# Patient Record
Sex: Female | Born: 1945 | Race: White | Hispanic: No | Marital: Married | Smoking: Never smoker
Health system: Southern US, Community
[De-identification: ages and names within clinical notes are randomized; demographics above are authoritative.]

## PROBLEM LIST (undated history)

## (undated) DIAGNOSIS — R001 Bradycardia, unspecified: Secondary | ICD-10-CM

## (undated) DIAGNOSIS — M1990 Unspecified osteoarthritis, unspecified site: Secondary | ICD-10-CM

## (undated) DIAGNOSIS — I517 Cardiomegaly: Secondary | ICD-10-CM

## (undated) DIAGNOSIS — R0602 Shortness of breath: Secondary | ICD-10-CM

## (undated) DIAGNOSIS — M8580 Other specified disorders of bone density and structure, unspecified site: Secondary | ICD-10-CM

## (undated) DIAGNOSIS — K5790 Diverticulosis of intestine, part unspecified, without perforation or abscess without bleeding: Secondary | ICD-10-CM

## (undated) DIAGNOSIS — R0600 Dyspnea, unspecified: Secondary | ICD-10-CM

## (undated) DIAGNOSIS — E785 Hyperlipidemia, unspecified: Secondary | ICD-10-CM

## (undated) DIAGNOSIS — R0609 Other forms of dyspnea: Secondary | ICD-10-CM

## (undated) DIAGNOSIS — K219 Gastro-esophageal reflux disease without esophagitis: Secondary | ICD-10-CM

## (undated) DIAGNOSIS — R0601 Orthopnea: Secondary | ICD-10-CM

## (undated) DIAGNOSIS — T7840XA Allergy, unspecified, initial encounter: Secondary | ICD-10-CM

## (undated) DIAGNOSIS — I493 Ventricular premature depolarization: Secondary | ICD-10-CM

## (undated) DIAGNOSIS — K862 Cyst of pancreas: Secondary | ICD-10-CM

## (undated) DIAGNOSIS — R6884 Jaw pain: Secondary | ICD-10-CM

## (undated) DIAGNOSIS — R079 Chest pain, unspecified: Secondary | ICD-10-CM

## (undated) DIAGNOSIS — K5900 Constipation, unspecified: Secondary | ICD-10-CM

## (undated) HISTORY — DX: Diverticulosis of intestine, part unspecified, without perforation or abscess without bleeding: K57.90

## (undated) HISTORY — DX: Allergy, unspecified, initial encounter: T78.40XA

## (undated) HISTORY — DX: Other forms of dyspnea: R06.09

## (undated) HISTORY — DX: Orthopnea: R06.01

## (undated) HISTORY — DX: Cyst of pancreas: K86.2

## (undated) HISTORY — DX: Other specified disorders of bone density and structure, unspecified site: M85.80

## (undated) HISTORY — PX: TONSILLECTOMY: SUR1361

## (undated) HISTORY — DX: Cardiomegaly: I51.7

## (undated) HISTORY — DX: Constipation, unspecified: K59.00

## (undated) HISTORY — DX: Dyspnea, unspecified: R06.00

## (undated) HISTORY — PX: BREAST BIOPSY: SHX20

## (undated) HISTORY — DX: Ventricular premature depolarization: I49.3

## (undated) HISTORY — DX: Jaw pain: R68.84

## (undated) HISTORY — DX: Bradycardia, unspecified: R00.1

## (undated) HISTORY — DX: Chest pain, unspecified: R07.9

## (undated) HISTORY — DX: Hyperlipidemia, unspecified: E78.5

## (undated) HISTORY — DX: Shortness of breath: R06.02

---

## 2000-05-09 ENCOUNTER — Encounter: Admission: RE | Admit: 2000-05-09 | Discharge: 2000-05-09 | Payer: Self-pay | Admitting: Obstetrics and Gynecology

## 2000-05-09 ENCOUNTER — Encounter: Payer: Self-pay | Admitting: Obstetrics and Gynecology

## 2001-04-03 ENCOUNTER — Other Ambulatory Visit: Admission: RE | Admit: 2001-04-03 | Discharge: 2001-04-03 | Payer: Self-pay | Admitting: Obstetrics and Gynecology

## 2001-04-03 ENCOUNTER — Encounter (INDEPENDENT_AMBULATORY_CARE_PROVIDER_SITE_OTHER): Payer: Self-pay | Admitting: *Deleted

## 2001-06-17 ENCOUNTER — Encounter: Admission: RE | Admit: 2001-06-17 | Discharge: 2001-06-17 | Payer: Self-pay | Admitting: Obstetrics and Gynecology

## 2001-06-17 ENCOUNTER — Encounter: Payer: Self-pay | Admitting: Obstetrics and Gynecology

## 2002-06-19 ENCOUNTER — Encounter: Payer: Self-pay | Admitting: Obstetrics and Gynecology

## 2002-06-19 ENCOUNTER — Encounter: Admission: RE | Admit: 2002-06-19 | Discharge: 2002-06-19 | Payer: Self-pay | Admitting: Obstetrics and Gynecology

## 2003-07-24 ENCOUNTER — Encounter: Admission: RE | Admit: 2003-07-24 | Discharge: 2003-07-24 | Payer: Self-pay | Admitting: Obstetrics and Gynecology

## 2004-07-10 ENCOUNTER — Emergency Department (HOSPITAL_COMMUNITY): Admission: EM | Admit: 2004-07-10 | Discharge: 2004-07-10 | Payer: Self-pay | Admitting: Emergency Medicine

## 2004-08-12 ENCOUNTER — Encounter: Admission: RE | Admit: 2004-08-12 | Discharge: 2004-08-12 | Payer: Self-pay | Admitting: Obstetrics and Gynecology

## 2004-12-15 ENCOUNTER — Ambulatory Visit: Payer: Self-pay | Admitting: Gastroenterology

## 2004-12-30 ENCOUNTER — Ambulatory Visit: Payer: Self-pay | Admitting: Gastroenterology

## 2004-12-30 HISTORY — PX: COLONOSCOPY: SHX174

## 2005-08-24 ENCOUNTER — Encounter: Admission: RE | Admit: 2005-08-24 | Discharge: 2005-08-24 | Payer: Self-pay | Admitting: Obstetrics and Gynecology

## 2006-05-16 ENCOUNTER — Ambulatory Visit: Payer: Self-pay | Admitting: Gastroenterology

## 2006-05-23 ENCOUNTER — Ambulatory Visit (HOSPITAL_COMMUNITY): Admission: RE | Admit: 2006-05-23 | Discharge: 2006-05-23 | Payer: Self-pay | Admitting: Gastroenterology

## 2006-05-23 ENCOUNTER — Encounter (INDEPENDENT_AMBULATORY_CARE_PROVIDER_SITE_OTHER): Payer: Self-pay | Admitting: Specialist

## 2006-05-29 ENCOUNTER — Ambulatory Visit: Payer: Self-pay | Admitting: Gastroenterology

## 2006-08-27 ENCOUNTER — Encounter: Admission: RE | Admit: 2006-08-27 | Discharge: 2006-08-27 | Payer: Self-pay | Admitting: Obstetrics and Gynecology

## 2007-08-29 ENCOUNTER — Encounter: Admission: RE | Admit: 2007-08-29 | Discharge: 2007-08-29 | Payer: Self-pay | Admitting: Obstetrics and Gynecology

## 2008-10-05 ENCOUNTER — Encounter: Admission: RE | Admit: 2008-10-05 | Discharge: 2008-10-05 | Payer: Self-pay | Admitting: Obstetrics & Gynecology

## 2009-10-13 ENCOUNTER — Encounter: Admission: RE | Admit: 2009-10-13 | Discharge: 2009-10-13 | Payer: Self-pay | Admitting: Obstetrics & Gynecology

## 2010-04-07 ENCOUNTER — Encounter: Admission: RE | Admit: 2010-04-07 | Discharge: 2010-04-07 | Payer: Self-pay | Admitting: Internal Medicine

## 2010-10-18 ENCOUNTER — Other Ambulatory Visit: Payer: Self-pay | Admitting: Obstetrics & Gynecology

## 2010-10-18 DIAGNOSIS — Z1231 Encounter for screening mammogram for malignant neoplasm of breast: Secondary | ICD-10-CM

## 2010-10-21 ENCOUNTER — Ambulatory Visit
Admission: RE | Admit: 2010-10-21 | Discharge: 2010-10-21 | Disposition: A | Discharge status: Home / Self Care | Payer: BC Managed Care – PPO | Source: Ambulatory Visit | Attending: Obstetrics & Gynecology | Admitting: Obstetrics & Gynecology

## 2010-10-21 DIAGNOSIS — Z1231 Encounter for screening mammogram for malignant neoplasm of breast: Secondary | ICD-10-CM

## 2011-09-26 ENCOUNTER — Other Ambulatory Visit: Payer: Self-pay | Admitting: Obstetrics & Gynecology

## 2011-09-26 DIAGNOSIS — Z1231 Encounter for screening mammogram for malignant neoplasm of breast: Secondary | ICD-10-CM

## 2011-09-29 ENCOUNTER — Encounter: Payer: Self-pay | Admitting: Gastroenterology

## 2011-10-11 ENCOUNTER — Encounter: Payer: Self-pay | Admitting: Gastroenterology

## 2011-10-11 ENCOUNTER — Ambulatory Visit (INDEPENDENT_AMBULATORY_CARE_PROVIDER_SITE_OTHER): Payer: BC Managed Care – PPO | Admitting: Gastroenterology

## 2011-10-11 VITALS — BP 150/80 | HR 70 | Ht 61.0 in | Wt 114.0 lb

## 2011-10-11 DIAGNOSIS — R1084 Generalized abdominal pain: Secondary | ICD-10-CM

## 2011-10-11 DIAGNOSIS — K5900 Constipation, unspecified: Secondary | ICD-10-CM

## 2011-10-11 DIAGNOSIS — R933 Abnormal findings on diagnostic imaging of other parts of digestive tract: Secondary | ICD-10-CM

## 2011-10-11 NOTE — Patient Instructions (Addendum)
  You have been scheduled for a CT scan of the abdomen and pelvis at [facility] ([address]---this is in the same building as [facility]).   You are scheduled on 10/12/11 at 2:30. You should arrive 30 minutes prior to your appointment time for registration. Please follow the written instructions below on the day of your exam:  WARNING: IF YOU ARE ALLERGIC TO IODINE/X-RAY DYE, PLEASE NOTIFY RADIOLOGY IMMEDIATELY AT [phone]! YOU WILL BE GIVEN A 13 HOUR PREMEDICATION PREP.  1) Do not eat or drink anything after 10:30am (4 hours prior to your test) 2) You have been given 2 bottles of oral contrast to drink. The solution may taste better if refrigerated, but do NOT add ice or any other liquid to this solution. Shake well before drinking.    You may take any medications as prescribed with a small amount of water except for the following: Metformin, Glucophage, Glucovance, Avandamet, Riomet, Fortamet, Actoplus Met, Janumet, Glumetza or Metaglip. The above medications must be held the day of the exam AND 48 hours after the exam.  If you have any questions regarding your exam or if you need to reschedule, you may call the CT department at [phone] between the hours of 8:00 am and 5:00 pm, Monday-Friday.  ________________________________________________________________________  Increase taking your Miralax 17 grams in 8 oz of water twice daily titrate depending on bowel movements.   cc: Guerry Bruin, MD

## 2011-10-11 NOTE — Progress Notes (Signed)
History of Present Illness: This is a 66-year-old female who has had worsening problems with constipation associated with generalized abdominal bloating and pain that radiates along both costal margins over the past few months. Her symptoms have temporarily improved with MiraLax and stool softeners. 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 Jacobs. 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 She underwent colonoscopy in April 2006 that showed mild sigmoid colon diverticulosis. Blood work from earlier this month from Dr. Tisovec's office was normal. Denies weight loss, diarrhea, change in stool caliber, melena, hematochezia, nausea, vomiting, dysphagia, reflux symptoms, chest pain.  No Known Allergies No outpatient prescriptions prior to visit.   Past Medical History  Diagnosis Date  . Diverticulosis   . Pancreatic cyst   . Hyperlipidemia   . Osteopenia   . Vitamin D deficiency    Past Surgical History  Procedure Date  . Tonsillectomy   . Breast biopsy     Right-Benign   History   Social History  . Marital Status: Married    Spouse Name: N/A    Number of Children: 2  . Years of Education: N/A   Occupational History  . Homemaker    Social History Main Topics  . Smoking status: Never Smoker   . Smokeless tobacco: Never Used  . Alcohol Use: Yes     socially  . Drug Use: No  . Sexually Active: None   Other Topics Concern  . None   Social History Narrative   Occ caffeine    Family History  Problem Relation Age of Onset  . Emphysema Father   . Emphysema Mother   . Osteoporosis Mother   . Atrial fibrillation Mother   . Macular degeneration Paternal Grandmother   . Colon cancer Neg Hx   . Throat cancer Mother   . Cancer Mother     Neck    Review of Systems: Pertinent positive and negative review of systems were  noted in the above HPI section. All other review of systems were otherwise negative.  Physical Exam: General: Well developed , well nourished, no acute distress Head: Normocephalic and atraumatic Eyes:  sclerae anicteric, EOMI Ears: Normal auditory acuity Mouth: No deformity or lesions Neck: Supple, no masses or thyromegaly Lungs: Clear throughout to auscultation Heart: Regular rate and rhythm; no murmurs, rubs or bruits Abdomen: Soft, non tender and non distended. No masses, hepatosplenomegaly or hernias noted. Normal Bowel sounds Musculoskeletal: Symmetrical with no gross deformities  Skin: No lesions on visible extremities Pulses:  Normal pulses noted Extremities: No clubbing, cyanosis, edema or deformities noted Neurological: Alert oriented x 4, grossly nonfocal Cervical Nodes:  No significant cervical adenopathy Inguinal Nodes: No significant inguinal adenopathy Psychological:  Alert and cooperative. Normal mood and affect  Assessment and Recommendations:  1. Constipation. Increase MiraLax to one dose every other day up to two doses per day titrated for adequate bowel movements. I suspect this is the main factor behind her generalized abdominal pain and bloating. If her symptoms do not improve consider colonoscopy.  2. Generalized abdominal pain. See problem #1. Schedule abdominal/pelvic CT scan.  3. 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 I reviewed the prior CT scan report and endoscopic ultrasound report with Dr. Jacobs today. Schedule pancreatic protocol CT followup and if the lesion has increased in size schedule repeat endoscopic ultrasound.   If it is stable or smaller no further followup needed. 

## 2011-10-12 ENCOUNTER — Ambulatory Visit (INDEPENDENT_AMBULATORY_CARE_PROVIDER_SITE_OTHER)
Admission: RE | Admit: 2011-10-12 | Discharge: 2011-10-12 | Disposition: A | Discharge status: Home / Self Care | Payer: BC Managed Care – PPO | Source: Ambulatory Visit | Attending: Gastroenterology | Admitting: Gastroenterology

## 2011-10-12 DIAGNOSIS — R933 Abnormal findings on diagnostic imaging of other parts of digestive tract: Secondary | ICD-10-CM

## 2011-10-12 DIAGNOSIS — R1084 Generalized abdominal pain: Secondary | ICD-10-CM

## 2011-10-12 MED ORDER — IOHEXOL 300 MG/ML  SOLN
100.0000 mL | Freq: Once | INTRAMUSCULAR | Status: AC | PRN
  Administered 2011-10-12: 100 mL via INTRAVENOUS

## 2011-10-18 ENCOUNTER — Telehealth: Payer: Self-pay

## 2011-10-18 DIAGNOSIS — K862 Cyst of pancreas: Secondary | ICD-10-CM

## 2011-10-18 NOTE — Telephone Encounter (Signed)
Message copied by Donata Duff on Wed Oct 18, 2011  4:17 PM ------      Message from: Rob Bunting P      Created: Wed Oct 18, 2011  3:51 PM       Lavonna Rua,      I looked at the CT scan, my EUS report from 2007. The cyst has grown slightly and I think a repeat EUS to see if CEA, cytology has also changed is reasonable. I will also send this to Shaunn Tackitt to schedule upper EUS, radial +/- linear EUS for pancreatic cyst. She required a LOT of meds last time so it is safest to do with propofol this time.            Thanks            dj            ----- Message -----         From: Rossie Muskrat, RN,CGRN         Sent: 10/18/2011   2:14 PM           To: Rob Bunting, MD            I have placed the cori report and the path from 07 on your desk.              Sheri      ----- Message -----         From: Rob Bunting, MD         Sent: 10/18/2011   1:51 PM           To: Rossie Muskrat, RN,CGRN            He did, but i have not gotten to it yet.  I looked at the CT report, can you pull my EUS from 2007, I'd like to see how I measured it comparted to the CT measurements currently.            Thanks                  ----- Message -----         From: Rossie Muskrat, RN,CGRN         Sent: 10/18/2011   1:34 PM           To: Rob Bunting, MD            Did Dr Russella Dar talk to you about doing an EUS on this patient?  Please review CT from 10/11/11 and advise.              Thanks,            NIKE

## 2011-10-19 ENCOUNTER — Other Ambulatory Visit: Payer: Self-pay

## 2011-10-19 NOTE — Telephone Encounter (Signed)
Pt has been instructed and meds reviewed.  She will call with any questions after receiving the information in the mail

## 2011-10-25 ENCOUNTER — Ambulatory Visit
Admission: RE | Admit: 2011-10-25 | Discharge: 2011-10-25 | Disposition: A | Discharge status: Home / Self Care | Payer: BC Managed Care – PPO | Source: Ambulatory Visit | Attending: Obstetrics & Gynecology | Admitting: Obstetrics & Gynecology

## 2011-10-25 DIAGNOSIS — Z1231 Encounter for screening mammogram for malignant neoplasm of breast: Secondary | ICD-10-CM

## 2011-11-06 ENCOUNTER — Encounter (HOSPITAL_COMMUNITY): Payer: Self-pay | Admitting: Gastroenterology

## 2011-11-09 ENCOUNTER — Ambulatory Visit (HOSPITAL_COMMUNITY): Payer: BC Managed Care – PPO | Admitting: Anesthesiology

## 2011-11-09 ENCOUNTER — Encounter (HOSPITAL_COMMUNITY)
Admission: RE | Disposition: A | Discharge status: Home / Self Care | Payer: Self-pay | Source: Ambulatory Visit | Attending: Gastroenterology

## 2011-11-09 ENCOUNTER — Encounter (HOSPITAL_COMMUNITY): Payer: Self-pay | Admitting: Anesthesiology

## 2011-11-09 ENCOUNTER — Encounter (HOSPITAL_COMMUNITY): Payer: Self-pay | Admitting: Gastroenterology

## 2011-11-09 ENCOUNTER — Ambulatory Visit (HOSPITAL_COMMUNITY)
Admission: RE | Admit: 2011-11-09 | Discharge: 2011-11-09 | Disposition: A | Discharge status: Home / Self Care | Payer: BC Managed Care – PPO | Source: Ambulatory Visit | Attending: Gastroenterology | Admitting: Gastroenterology

## 2011-11-09 DIAGNOSIS — Q391 Atresia of esophagus with tracheo-esophageal fistula: Secondary | ICD-10-CM | POA: Insufficient documentation

## 2011-11-09 DIAGNOSIS — K5730 Diverticulosis of large intestine without perforation or abscess without bleeding: Secondary | ICD-10-CM | POA: Insufficient documentation

## 2011-11-09 DIAGNOSIS — E785 Hyperlipidemia, unspecified: Secondary | ICD-10-CM | POA: Insufficient documentation

## 2011-11-09 DIAGNOSIS — R1084 Generalized abdominal pain: Secondary | ICD-10-CM | POA: Insufficient documentation

## 2011-11-09 DIAGNOSIS — K862 Cyst of pancreas: Secondary | ICD-10-CM | POA: Insufficient documentation

## 2011-11-09 DIAGNOSIS — R933 Abnormal findings on diagnostic imaging of other parts of digestive tract: Secondary | ICD-10-CM

## 2011-11-09 DIAGNOSIS — E559 Vitamin D deficiency, unspecified: Secondary | ICD-10-CM | POA: Insufficient documentation

## 2011-11-09 DIAGNOSIS — M899 Disorder of bone, unspecified: Secondary | ICD-10-CM | POA: Insufficient documentation

## 2011-11-09 DIAGNOSIS — K5900 Constipation, unspecified: Secondary | ICD-10-CM | POA: Insufficient documentation

## 2011-11-09 DIAGNOSIS — M949 Disorder of cartilage, unspecified: Secondary | ICD-10-CM | POA: Insufficient documentation

## 2011-11-09 HISTORY — PX: EUS: SHX5427

## 2011-11-09 HISTORY — DX: Unspecified osteoarthritis, unspecified site: M19.90

## 2011-11-09 HISTORY — PX: FINE NEEDLE ASPIRATION: SHX5430

## 2011-11-09 LAB — PANC CYST FLD ANLYS-PATHFNDR-TG

## 2011-11-09 SURGERY — UPPER ENDOSCOPIC ULTRASOUND (EUS) LINEAR
Anesthesia: Monitor Anesthesia Care

## 2011-11-09 MED ORDER — FENTANYL CITRATE 0.05 MG/ML IJ SOLN: 25.0000 ug | INTRAMUSCULAR | Status: DC | PRN

## 2011-11-09 MED ORDER — LACTATED RINGERS IV SOLN
INTRAVENOUS | Status: DC
  Administered 2011-11-09: 1000 mL via INTRAVENOUS

## 2011-11-09 MED ORDER — PROMETHAZINE HCL 25 MG/ML IJ SOLN: 6.2500 mg | INTRAMUSCULAR | Status: DC | PRN

## 2011-11-09 MED ORDER — PROPOFOL 10 MG/ML IV EMUL
INTRAVENOUS | Status: DC | PRN
  Administered 2011-11-09: 50 ug/kg/min via INTRAVENOUS

## 2011-11-09 MED ORDER — CIPROFLOXACIN IN D5W 400 MG/200ML IV SOLN
400.0000 mg | Freq: Two times a day (BID) | INTRAVENOUS | Status: DC
  Administered 2011-11-09: 400 mg via INTRAVENOUS

## 2011-11-09 MED ORDER — BUTAMBEN-TETRACAINE-BENZOCAINE 2-2-14 % EX AERO
INHALATION_SPRAY | CUTANEOUS | Status: DC | PRN
  Administered 2011-11-09: 2 via TOPICAL

## 2011-11-09 MED ORDER — FENTANYL CITRATE 0.05 MG/ML IJ SOLN
INTRAMUSCULAR | Status: DC | PRN
  Administered 2011-11-09: 100 ug via INTRAVENOUS

## 2011-11-09 MED ORDER — KETAMINE HCL 10 MG/ML IJ SOLN
INTRAMUSCULAR | Status: DC | PRN
  Administered 2011-11-09: 10 mg via INTRAVENOUS

## 2011-11-09 MED ORDER — LIDOCAINE HCL (CARDIAC) 20 MG/ML IV SOLN
INTRAVENOUS | Status: DC | PRN
  Administered 2011-11-09: 100 mg via INTRAVENOUS

## 2011-11-09 MED ORDER — CIPROFLOXACIN IN D5W 400 MG/200ML IV SOLN
INTRAVENOUS | Status: AC
  Filled 2011-11-09: qty 200

## 2011-11-09 MED ORDER — CIPROFLOXACIN HCL 500 MG PO TABS: 500.0000 mg | ORAL_TABLET | Freq: Two times a day (BID) | ORAL | Status: AC

## 2011-11-09 MED ORDER — ONDANSETRON HCL 4 MG/2ML IJ SOLN
INTRAMUSCULAR | Status: DC | PRN
  Administered 2011-11-09: 4 mg via INTRAVENOUS

## 2011-11-09 MED ORDER — MIDAZOLAM HCL 5 MG/5ML IJ SOLN
INTRAMUSCULAR | Status: DC | PRN
  Administered 2011-11-09: 2 mg via INTRAVENOUS

## 2011-11-09 NOTE — Anesthesia Postprocedure Evaluation (Signed)
Anesthesia Post Note  Patient: Jessica Osborn  Procedure(s) Performed: Procedure(s) (LRB): UPPER ENDOSCOPIC ULTRASOUND (EUS) LINEAR (N/A)  Anesthesia type: MAC  Patient location: PACU  Post pain: Pain level controlled  Post assessment: Post-op Vital signs reviewed  Last Vitals:  Filed Vitals:   11/09/11 1030  BP: 129/60  Temp:   Resp: 14    Post vital signs: Reviewed  Level of consciousness: sedated  Complications: No apparent anesthesia complications

## 2011-11-09 NOTE — H&P (View-Only) (Signed)
History of Present Illness: This is a 66 year old female who has had worsening problems with constipation associated with generalized abdominal bloating and pain that radiates along both costal margins over the past few months. Her symptoms have temporarily improved with MiraLax and stool softeners. 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 Christella Hartigan. Cyst aspiration revealed a CEA of less than 1 and Dr. 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 She underwent colonoscopy in April 2006 that showed mild sigmoid colon diverticulosis. Blood work from earlier this month from Dr. Deneen Harts office was normal. Denies weight loss, diarrhea, change in stool caliber, melena, hematochezia, nausea, vomiting, dysphagia, reflux symptoms, chest pain.  No Known Allergies No outpatient prescriptions prior to visit.   Past Medical History  Diagnosis Date  . Diverticulosis   . Pancreatic cyst   . Hyperlipidemia   . Osteopenia   . Vitamin D deficiency    Past Surgical History  Procedure Date  . Tonsillectomy   . Breast biopsy     Right-Benign   History   Social History  . Marital Status: Married    Spouse Name: N/A    Number of Children: 2  . Years of Education: N/A   Occupational History  . Homemaker    Social History Main Topics  . Smoking status: Never Smoker   . Smokeless tobacco: Never Used  . Alcohol Use: Yes     socially  . Drug Use: No  . Sexually Active: None   Other Topics Concern  . None   Social History Narrative   Occ caffeine    Family History  Problem Relation Age of Onset  . Emphysema Father   . Emphysema Mother   . Osteoporosis Mother   . Atrial fibrillation Mother   . Macular degeneration Paternal Grandmother   . Colon cancer Neg Hx   . Throat cancer Mother   . Cancer Mother     Neck    Review of Systems: Pertinent positive and negative review of systems were  noted in the above HPI section. All other review of systems were otherwise negative.  Physical Exam: General: Well developed , well nourished, no acute distress Head: Normocephalic and atraumatic Eyes:  sclerae anicteric, EOMI Ears: Normal auditory acuity Mouth: No deformity or lesions Neck: Supple, no masses or thyromegaly Lungs: Clear throughout to auscultation Heart: Regular rate and rhythm; no murmurs, rubs or bruits Abdomen: Soft, non tender and non distended. No masses, hepatosplenomegaly or hernias noted. Normal Bowel sounds Musculoskeletal: Symmetrical with no gross deformities  Skin: No lesions on visible extremities Pulses:  Normal pulses noted Extremities: No clubbing, cyanosis, edema or deformities noted Neurological: Alert oriented x 4, grossly nonfocal Cervical Nodes:  No significant cervical adenopathy Inguinal Nodes: No significant inguinal adenopathy Psychological:  Alert and cooperative. Normal mood and affect  Assessment and Recommendations:  1. Constipation. Increase MiraLax to one dose every other day up to two doses per day titrated for adequate bowel movements. I suspect this is the main factor behind her generalized abdominal pain and bloating. If her symptoms do not improve consider colonoscopy.  2. Generalized abdominal pain. See problem #1. Schedule abdominal/pelvic CT scan.  3. 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 I reviewed the prior CT scan report and endoscopic ultrasound report with Dr. Christella Hartigan today. Schedule pancreatic protocol CT followup and if the lesion has increased in size schedule repeat endoscopic ultrasound.  If it is stable or smaller no further followup needed.

## 2011-11-09 NOTE — Anesthesia Preprocedure Evaluation (Addendum)
Anesthesia Evaluation  Patient identified by MRN, date of birth, ID band Patient awake    Reviewed: Allergy & Precautions, H&P , NPO status , Patient's Chart, lab work & pertinent test results, reviewed documented beta blocker date and time   Airway Mallampati: II TM Distance: >3 FB Neck ROM: full    Dental No notable dental hx.    Pulmonary neg pulmonary ROS,  clear to auscultation  Pulmonary exam normal       Cardiovascular Exercise Tolerance: Good neg cardio ROS regular Normal    Neuro/Psych Negative Neurological ROS  Negative Psych ROS   GI/Hepatic negative GI ROS, Neg liver ROS,   Endo/Other  Negative Endocrine ROS  Renal/GU negative Renal ROS  Genitourinary negative   Musculoskeletal   Abdominal Normal abdominal exam  (+)   Peds  Hematology negative hematology ROS (+)   Anesthesia Other Findings   Reproductive/Obstetrics negative OB ROS                           Anesthesia Physical Anesthesia Plan  ASA: I  Anesthesia Plan: MAC   Post-op Pain Management:    Induction:   Airway Management Planned:   Additional Equipment:   Intra-op Plan:   Post-operative Plan:   Informed Consent: I have reviewed the patients History and Physical, chart, labs and discussed the procedure including the risks, benefits and alternatives for the proposed anesthesia with the patient or authorized representative who has indicated his/her understanding and acceptance.   Dental Advisory Given  Plan Discussed with: CRNA  Anesthesia Plan Comments:        Anesthesia Quick Evaluation

## 2011-11-09 NOTE — Op Note (Signed)
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 Christella Hartigan. Cyst aspiration revealed a CEA of less than 1 and Dr. 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 The patient was then placed in the left, lateral, decubitus postion and IV sedation was administered. Throughout the procedure, the patient's blood pressure, pulse and oxygen saturations were monitored continuously.  Under direct visualization, the Pentax Radial EUS [id number] endoscope was introduced through the mouth and advanced to the second portion of the duodenum.  Water was used as necessary to provide an acoustic interface.  Upon completion of the imaging, water was removed and the patient was sent to the recovery room in satisfactory condition. <<PROCEDUREIMAGES>>  Endoscopic findings: 1. Schatzki's ring at GE junction 2. Otherwise normal UGI tract.  EUS findings: 1. 3.8cm by 2.9cm cystic lesion in body of pancreas. This lesion is comprises of several small cysts with both thin and thick septea separating them.  There are no obvious solid masses in or near the cystic lesion. The main pancreatic duct runs through the cystic lesion and may communicate directly with it. Both proximal and distal to the cystic lesion the main pancreatic duct is normal (non-dilated).  Fluid was aspirated from the lesion using a single pass of a 22gauge BS  EUS FNA needle.  3cc of blod tinged fluid was removed and sent for testing. 2. Otherwise pancreatic parenchyma was normal (no other masses and no signs of chronic pancreatitis. 3. CBD was normal, non-dilated. 4. No peripancreatic adenopathy. 5. Limited views of liver, spleen, portal and splenic vessels were all normal.  Impression: Schatzki's ring, not dilated during this exam. 3.8cm multicystic lesion in body of pancreas. No clear concerning findings (such as solid masses, proximal pancreatic duct dilation).  3cc of blood tinged cyst fluid was aspirated and sent for CEA, amylase, cytology. This will be compared to her previous FNA results 6-7 years ago. The abdominal pains that prompted CT scan have greatly improved since her constipation has improved (with miralax per Dr. Russella Dar). She will complete 3 days of twice daily cipro.  ______________________________ Rachael Fee, MD  n. eSIGNED:   Rachael Fee at 11/09/2011 10:10 AM  Jessica Osborn, [id number]

## 2011-11-09 NOTE — Transfer of Care (Signed)
Immediate Anesthesia Transfer of Care Note  Patient: Jessica Osborn  Procedure(s) Performed: Procedure(s) (LRB): UPPER ENDOSCOPIC ULTRASOUND (EUS) LINEAR (N/A)  Patient Location: PACU  Anesthesia Type: MAC  Level of Consciousness: sedated, patient cooperative and responds to stimulaton  Airway & Oxygen Therapy: Patient Spontanous Breathing and Patient connected to face mask oxgen  Post-op Assessment: Report given to PACU RN and Post -op Vital signs reviewed and stable  Post vital signs: Reviewed and stable  Complications: No apparent anesthesia complications

## 2011-11-09 NOTE — Interval H&P Note (Signed)
History and Physical Interval Note:  11/09/2011 9:21 AM  Jessica Osborn  has presented today for surgery, with the diagnosis of Pancreatic cyst [577.2]  The various methods of treatment have been discussed with the patient and family. After consideration of risks, benefits and other options for treatment, the patient has consented to  Procedure(s) (LRB): UPPER ENDOSCOPIC ULTRASOUND (EUS) LINEAR (N/A) as a surgical intervention .  The patients' history has been reviewed, patient examined, no change in status, stable for surgery.  I have reviewed the patients' chart and labs.  Questions were answered to the patient's satisfaction.     Rob Bunting

## 2011-11-10 ENCOUNTER — Encounter (HOSPITAL_COMMUNITY): Payer: Self-pay | Admitting: Gastroenterology

## 2011-11-17 ENCOUNTER — Encounter: Payer: Self-pay | Admitting: Gastroenterology

## 2012-01-02 ENCOUNTER — Telehealth: Payer: Self-pay | Admitting: Gastroenterology

## 2012-01-02 NOTE — Telephone Encounter (Signed)
Patient is advised to titrate Miralax as needed.  She verbalized understanding.  She will call back for further questions or concerns

## 2012-09-26 ENCOUNTER — Other Ambulatory Visit: Payer: Self-pay | Admitting: Obstetrics & Gynecology

## 2012-09-26 DIAGNOSIS — Z1231 Encounter for screening mammogram for malignant neoplasm of breast: Secondary | ICD-10-CM

## 2012-10-28 ENCOUNTER — Ambulatory Visit
Admission: RE | Admit: 2012-10-28 | Discharge: 2012-10-28 | Disposition: A | Discharge status: Home / Self Care | Payer: BC Managed Care – PPO | Source: Ambulatory Visit | Attending: Obstetrics & Gynecology | Admitting: Obstetrics & Gynecology

## 2012-10-28 DIAGNOSIS — Z1231 Encounter for screening mammogram for malignant neoplasm of breast: Secondary | ICD-10-CM

## 2012-12-20 ENCOUNTER — Encounter: Payer: Self-pay | Admitting: Gastroenterology

## 2013-02-25 ENCOUNTER — Telehealth: Payer: Self-pay | Admitting: Gastroenterology

## 2013-02-25 NOTE — Telephone Encounter (Signed)
Patient reports 4-5 days of bloating and upper abdominal pain along her rib cage.  She denies constipation, takes Miralax 2-3 times a week.  She also c/o nausea after a meal.  She will come in and see Mike Gip PA tomorrow at 2:00

## 2013-02-26 ENCOUNTER — Ambulatory Visit (INDEPENDENT_AMBULATORY_CARE_PROVIDER_SITE_OTHER): Payer: Medicare Other | Admitting: Physician Assistant

## 2013-02-26 ENCOUNTER — Encounter: Payer: Self-pay | Admitting: Physician Assistant

## 2013-02-26 ENCOUNTER — Other Ambulatory Visit (INDEPENDENT_AMBULATORY_CARE_PROVIDER_SITE_OTHER): Payer: Medicare Other

## 2013-02-26 VITALS — BP 128/72 | HR 60 | Ht 61.0 in | Wt 112.0 lb

## 2013-02-26 DIAGNOSIS — K5730 Diverticulosis of large intestine without perforation or abscess without bleeding: Secondary | ICD-10-CM | POA: Insufficient documentation

## 2013-02-26 DIAGNOSIS — K863 Pseudocyst of pancreas: Secondary | ICD-10-CM

## 2013-02-26 DIAGNOSIS — K862 Cyst of pancreas: Secondary | ICD-10-CM

## 2013-02-26 DIAGNOSIS — K5732 Diverticulitis of large intestine without perforation or abscess without bleeding: Secondary | ICD-10-CM

## 2013-02-26 DIAGNOSIS — R1012 Left upper quadrant pain: Secondary | ICD-10-CM

## 2013-02-26 LAB — CBC WITH DIFFERENTIAL/PLATELET
Basophils Absolute: 0.1 10*3/uL (ref 0.0–0.1)
Basophils Relative: 0.7 % (ref 0.0–3.0)
Eosinophils Absolute: 0 10*3/uL (ref 0.0–0.7)
Lymphocytes Relative: 24.1 % (ref 12.0–46.0)
MCHC: 33.3 g/dL (ref 30.0–36.0)
Neutrophils Relative %: 68.5 % (ref 43.0–77.0)
RBC: 4.87 Mil/uL (ref 3.87–5.11)

## 2013-02-26 LAB — COMPREHENSIVE METABOLIC PANEL
ALT: 15 U/L (ref 0–35)
AST: 18 U/L (ref 0–37)
Albumin: 4.5 g/dL (ref 3.5–5.2)
BUN: 12 mg/dL (ref 6–23)
Calcium: 9.5 mg/dL (ref 8.4–10.5)
Chloride: 98 mEq/L (ref 96–112)
Potassium: 4 mEq/L (ref 3.5–5.1)

## 2013-02-26 MED ORDER — CIPROFLOXACIN HCL 500 MG PO TABS: 500.0000 mg | ORAL_TABLET | Freq: Two times a day (BID) | ORAL | Status: AC

## 2013-02-26 MED ORDER — ALIGN PO CAPS: 1.0000 | ORAL_CAPSULE | Freq: Every day | ORAL | Status: AC

## 2013-02-26 NOTE — Progress Notes (Signed)
Reviewed and agree with management plan.  Daje [place] Perra T. Loras Grieshop, MD FACG 

## 2013-02-26 NOTE — Patient Instructions (Addendum)
We sent a prescription for Cipro 500 mg twice daily to [facility]. We have given you Align capsule samples. Take 1 daily for 14 days.    Call us back Monday if not improved or at any point if the pain worsens.

## 2013-02-26 NOTE — Progress Notes (Signed)
Subjective:    Patient ID: Jessica Osborn, female    DOB: 01/25/1946, 67 y.o.   MRN: [id number]  HPI  Jessica Osborn is a pleasant 67 year old white female known to Dr. Russella Dar. She had undergone colonoscopy in 2006 with finding of transverse colon diverticulosis and mild sigmoid diverticulosis. She is known to have a cystic pancreatic lesion in the body of the pancreas and had most recently had an endoscopic ultrasound in February 2013 per Dr. Christella Hartigan which showed a 3.8 cm multicystic lesion with no clear concerning findings area the cyst was aspirated and the cytology was benign. She comes in today with complaints of one and a half week history of intermittent left upper quadrant discomfort. She says she had been out of town and on dictation with her husband and was eating a bit differently. She says she generally takes MiraLax once a day for constipation but was not taking that regularly while on vacation. She says now over the past 4 days she has had more constant achy discomfort in the left abdomen which she says is not severe but I normally. She also complains of feeling bloated and has a pressure sensation under her ribs bilaterally and into her back. She's not had any fever or chills, no change in her bowel habits. No nausea or vomiting and appetite has been fine. Patient states that this discomfort is reminiscent of previous episodes of diverticulitis. She denies any urinary symptoms.    Review of Systems  Constitutional: Negative.   HENT: Negative.   Eyes: Negative.   Respiratory: Negative.   Cardiovascular: Negative.   Gastrointestinal: Positive for abdominal pain.  Endocrine: Negative.   Musculoskeletal: Positive for back pain.  Skin: Negative.   Allergic/Immunologic: Negative.   Neurological: Negative.   Hematological: Negative.   Psychiatric/Behavioral: Negative.    Outpatient Prescriptions Prior to Visit  Medication Sig Dispense Refill  . Calcium Carbonate-Vitamin D 600-400 MG-UNIT per  tablet Take 1 tablet by mouth daily.      . Cholecalciferol (VITAMIN D3) 1000 UNITS CAPS Take 1 capsule by mouth daily.      . Magnesium 250 MG TABS Take 1 tablet by mouth daily.      . Polyethylene Glycol 3350 (MIRALAX PO) Take by mouth as needed.       No facility-administered medications prior to visit.      No Known Allergies Patient Active Problem List   Diagnosis Date Noted  . Diverticulosis of colon without hemorrhage 02/26/2013  . Pancreatic cyst 02/26/2013   History   Social History Narrative   Occ caffeine    family history includes Atrial fibrillation in her mother; COPD in her father and mother; Cancer in her mother; Emphysema in her father and mother; Macular degeneration in her paternal grandmother; Osteoporosis in her mother; and Throat cancer in her mother.  There is no history of Colon cancer.  Objective:   Physical Exam  well-developed white female in no acute distress, pleasant blood pressure 120/72 pulse 60 height 5 foot 1 weight 112. HEENT; nontraumatic normocephalic EOMI PERRLA sclera anicteric,Neck; Supple no JVD, Cardiovascular; regular rate and rhythm with S1-S2 no murmur rub or gallop, Pulmonary; clear bilaterally, Abdomen ;soft bowel sounds are present she is mildly tender in the left upper quadrant there is no guarding or rebound no palpable mass or hepatosplenomegaly, she has no tenderness to palpation over the anterior ribs her sternum, Rectal; exam not done, Extremities; no clubbing cyanosis or edema skin warm and dry, Psych; mood and affect normal  and appropriate.        Assessment & Plan:  #65 67 year old female with one and a half week history of somewhat progressive left upper quadrant most consistent with a diverticulitis. Patient has previously documented transverse colon diverticulosis as well as sigmoid diverticulosis. #2 previously documented 3.8 cm multicystic lesion in the body of the pancreas-status post EUS and FNA 2013/benign tssue  Plan;  we'll check CBC with differential, CMET,and lipase Start Cipro 500 mg by mouth twice daily with food x10 days Start Align one by mouth daily x2 weeks Gradually advance to regular diet Patient is advised to call back in 5 days if her pain has not improved significantly and or at any point if her pain worsens. If she fails to improve on Cipro she will need CT of the abdomen and pelvis.

## 2013-03-06 ENCOUNTER — Telehealth: Payer: Self-pay | Admitting: Physician Assistant

## 2013-03-06 DIAGNOSIS — R1011 Right upper quadrant pain: Secondary | ICD-10-CM

## 2013-03-06 NOTE — Telephone Encounter (Signed)
Scheduled CT at [facility] on 03/11/13 at 1:30 PM. NPO 4 hours prior. Drink contrast 2 and 1 hour prior. Patient aware. She may change the date of CT. Per Rose, does patient need pancreatic protocol. Per Mike Gip, PA , do CT with pancreatic protocol. Rose aware.

## 2013-03-06 NOTE — Telephone Encounter (Signed)
Please order  Ct abd/pelvis with contrast for her. Hx of pancreatic cyst/3 week hx of upper abdominal and lower chest pain- thanks

## 2013-03-06 NOTE — Telephone Encounter (Signed)
Patient saw Mike Gip, PA last week. She put her on Cipro. She is calling to report continued pain in left upper abdomen/chest area that radiates to under her arms. She does not think this is diverticulitis. She states Aleve helps the pain more than anything. Slight nausea. Breasts feel swollen. Please, advise.

## 2013-03-07 ENCOUNTER — Ambulatory Visit (INDEPENDENT_AMBULATORY_CARE_PROVIDER_SITE_OTHER)
Admission: RE | Admit: 2013-03-07 | Discharge: 2013-03-07 | Disposition: A | Discharge status: Home / Self Care | Payer: Medicare Other | Source: Ambulatory Visit | Attending: Physician Assistant | Admitting: Physician Assistant

## 2013-03-07 DIAGNOSIS — R1011 Right upper quadrant pain: Secondary | ICD-10-CM

## 2013-03-07 MED ORDER — IOHEXOL 350 MG/ML SOLN
100.0000 mL | Freq: Once | INTRAVENOUS | Status: AC | PRN
  Administered 2013-03-07: 100 mL via INTRAVENOUS

## 2013-03-11 ENCOUNTER — Other Ambulatory Visit: Payer: Medicare Other

## 2013-03-18 ENCOUNTER — Telehealth: Payer: Self-pay | Admitting: Physician Assistant

## 2013-03-18 DIAGNOSIS — K769 Liver disease, unspecified: Secondary | ICD-10-CM

## 2013-03-18 NOTE — Telephone Encounter (Signed)
See CT results for details 

## 2013-03-25 ENCOUNTER — Ambulatory Visit
Admission: RE | Admit: 2013-03-25 | Discharge: 2013-03-25 | Disposition: A | Discharge status: Home / Self Care | Payer: Medicare Other | Source: Ambulatory Visit | Attending: Gastroenterology | Admitting: Gastroenterology

## 2013-03-25 DIAGNOSIS — K769 Liver disease, unspecified: Secondary | ICD-10-CM

## 2013-03-25 MED ORDER — GADOBENATE DIMEGLUMINE 529 MG/ML IV SOLN
9.0000 mL | Freq: Once | INTRAVENOUS | Status: AC | PRN
  Administered 2013-03-25: 9 mL via INTRAVENOUS

## 2013-10-16 ENCOUNTER — Other Ambulatory Visit: Payer: Self-pay

## 2013-10-16 DIAGNOSIS — Z1231 Encounter for screening mammogram for malignant neoplasm of breast: Secondary | ICD-10-CM

## 2013-10-31 ENCOUNTER — Ambulatory Visit
Admission: RE | Admit: 2013-10-31 | Discharge: 2013-10-31 | Disposition: A | Discharge status: Home / Self Care | Payer: Medicare Other | Source: Ambulatory Visit

## 2013-10-31 DIAGNOSIS — Z1231 Encounter for screening mammogram for malignant neoplasm of breast: Secondary | ICD-10-CM

## 2014-03-26 ENCOUNTER — Telehealth: Payer: Self-pay

## 2014-03-26 DIAGNOSIS — K862 Cyst of pancreas: Secondary | ICD-10-CM

## 2014-03-26 NOTE — Telephone Encounter (Signed)
Message copied by Marlon Pel on Thu Mar 26, 2014  2:25 PM ------      Message from: Marlon Pel      Created: Tue Mar 25, 2013  4:03 PM       Patient needs MRI to follow up pancreatic lesion from 03/2013 ------

## 2014-03-26 NOTE — Telephone Encounter (Signed)
Left message for patient to call back  

## 2014-03-30 NOTE — Telephone Encounter (Signed)
I spoke with patient and she is scheduled for MRI for 04/07/14 12:15 at [facility] .  She is asked to be 4 hours NPO and to come for labs this week for BUN and creatinine.

## 2014-03-31 ENCOUNTER — Other Ambulatory Visit (INDEPENDENT_AMBULATORY_CARE_PROVIDER_SITE_OTHER): Payer: Medicare Other

## 2014-03-31 ENCOUNTER — Other Ambulatory Visit: Payer: Self-pay

## 2014-03-31 DIAGNOSIS — K869 Disease of pancreas, unspecified: Secondary | ICD-10-CM

## 2014-03-31 DIAGNOSIS — K862 Cyst of pancreas: Secondary | ICD-10-CM

## 2014-03-31 DIAGNOSIS — K863 Pseudocyst of pancreas: Secondary | ICD-10-CM

## 2014-03-31 LAB — BUN: BUN: 14 mg/dL (ref 6–23)

## 2014-03-31 LAB — CREATININE, SERUM: CREATININE: 0.8 mg/dL (ref 0.4–1.2)

## 2014-04-07 ENCOUNTER — Ambulatory Visit
Admission: RE | Admit: 2014-04-07 | Discharge: 2014-04-07 | Disposition: A | Discharge status: Home / Self Care | Payer: Medicare Other | Source: Ambulatory Visit | Attending: Gastroenterology | Admitting: Gastroenterology

## 2014-04-07 DIAGNOSIS — K862 Cyst of pancreas: Secondary | ICD-10-CM

## 2014-04-07 MED ORDER — GADOBENATE DIMEGLUMINE 529 MG/ML IV SOLN
10.0000 mL | Freq: Once | INTRAVENOUS | Status: AC | PRN
  Administered 2014-04-07: 10 mL via INTRAVENOUS

## 2014-10-05 ENCOUNTER — Other Ambulatory Visit: Payer: Self-pay

## 2014-10-05 DIAGNOSIS — Z1231 Encounter for screening mammogram for malignant neoplasm of breast: Secondary | ICD-10-CM

## 2014-11-03 ENCOUNTER — Ambulatory Visit
Admission: RE | Admit: 2014-11-03 | Discharge: 2014-11-03 | Disposition: A | Discharge status: Home / Self Care | Payer: Medicare Other | Source: Ambulatory Visit

## 2014-11-03 DIAGNOSIS — Z1231 Encounter for screening mammogram for malignant neoplasm of breast: Secondary | ICD-10-CM

## 2014-11-18 ENCOUNTER — Telehealth: Payer: Self-pay | Admitting: Gastroenterology

## 2014-11-18 NOTE — Telephone Encounter (Signed)
Patient reports that she has had a one week history of abdominal pain and bloating.  She feels the pain is consistent with a past episode of diverticulitis.  She has tried a liquid diet and found an old rx of cipro she started herself on 4 days ago.  She has failed to improve.  She will come in and see Nicoletta Ba PA tomorrow at 1:30

## 2014-11-18 NOTE — Telephone Encounter (Signed)
Left message for patient to call back  

## 2014-11-19 ENCOUNTER — Ambulatory Visit (INDEPENDENT_AMBULATORY_CARE_PROVIDER_SITE_OTHER): Payer: Medicare Other | Admitting: Physician Assistant

## 2014-11-19 ENCOUNTER — Encounter: Payer: Self-pay | Admitting: Physician Assistant

## 2014-11-19 VITALS — BP 134/60 | HR 60 | Ht 59.25 in | Wt 113.4 lb

## 2014-11-19 DIAGNOSIS — K5732 Diverticulitis of large intestine without perforation or abscess without bleeding: Secondary | ICD-10-CM

## 2014-11-19 MED ORDER — CIPROFLOXACIN HCL 500 MG PO TABS: 500.0000 mg | ORAL_TABLET | Freq: Two times a day (BID) | ORAL | Status: DC

## 2014-11-19 MED ORDER — SACCHAROMYCES BOULARDII 250 MG PO CAPS: 250.0000 mg | ORAL_CAPSULE | Freq: Two times a day (BID) | ORAL | Status: DC

## 2014-11-19 NOTE — Patient Instructions (Signed)
We sent prescriptions to [facility]. 1. Cipro 500 mg 2. Florastor twice daily,  You may take a probiotic: We suggest the following.  Align Culterelle You can get this at [employer] or your pharmacy.  Call us back to schedule the colonoscopy with Dr. Fuller Plan.

## 2014-11-19 NOTE — Progress Notes (Signed)
Reviewed and agree with management plan.  Malcolm T. Stark, MD FACG 

## 2014-11-19 NOTE — Progress Notes (Signed)
Patient ID: Jessica Osborn, female   DOB: 13-Jul-1946, 69 y.o.   MRN: [id number]   Subjective:    Patient ID: Jessica Osborn, female    DOB: 1946/02/26, 69 y.o.   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 She has been seen in our office and treated for diverticulitis. She was last seen in June 2014. Last colonoscopy was 2006 she has transverse and left colon diverticulosis.  Patient comes in today stating that she had onset of abdominal pain about 5 days ago. She says she's felt it coming on for about 2 weeks. He feels swollen and tender and very similar to her prior episode of diverticulitis. She placed herself on a clear liquid diet about 4 days ago. She says she was feeling worse after eating and is still a bit more uncomfortable after eating. She also had some Cipro at home and started herself on Cipro 4 days ago. She says she does feel that she is improving but is still uncomfortable. She's not had any fever chills or sweats no nausea or vomiting but has felt fatigued and lethargic. He has not had a bowel movement over the past 4 days but again has been on just liquids.  Review of Systems Pertinent positive and negative review of systems were noted in the above HPI section.  All other review of systems was otherwise negative.  Outpatient Encounter Prescriptions as of 11/19/2014  Medication Sig  . bifidobacterium infantis (ALIGN) capsule Take 1 capsule by mouth daily.  . Calcium Carbonate-Vitamin D 600-400 MG-UNIT per tablet Take 1 tablet by mouth daily.  . Cholecalciferol (VITAMIN D3) 1000 UNITS CAPS Take 1 capsule by mouth daily.  Marland Kitchen denosumab (PROLIA) 60 MG/ML SOLN injection Inject 60 mg into the skin every 6 (six) months. Administer in upper arm, thigh, or abdomen  . Magnesium 250 MG TABS Take 1 tablet by mouth daily.  . Polyethylene Glycol 3350 (MIRALAX PO) Take by mouth as  needed.  . ciprofloxacin (CIPRO) 500 MG tablet Take 1 tablet (500 mg total) by mouth 2 (two) times daily.  Marland Kitchen saccharomyces boulardii (FLORASTOR) 250 MG capsule Take 1 capsule (250 mg total) by mouth 2 (two) times daily.   No Known Allergies Patient Active Problem List   Diagnosis Date Noted  . Diverticulosis of colon without hemorrhage 02/26/2013  . Pancreatic cyst 02/26/2013   History   Social History  . Marital Status: Married    Spouse Name: N/A  . Number of Children: 2  . Years of Education: N/A   Occupational History  . Homemaker    Social History Main Topics  . Smoking status: Never Smoker   . Smokeless tobacco: Never Used  . Alcohol Use: Yes     Comment: socially  . Drug Use: No  . Sexual Activity: Not on file   Other Topics Concern  . Not on file   Social History Narrative   Occ caffeine     [name]'s family history includes Atrial fibrillation in her mother; COPD in her father and mother; Cancer in her mother; Emphysema in her father and mother; Macular degeneration in her paternal grandmother; Osteoporosis in her mother; Throat cancer in her mother. There is no history of Colon cancer.      Objective:    Filed Vitals:   11/19/14 1323  BP: 134/60  Pulse: 60  Physical Exam   Well-developed white female in no acute distress, pleasant blood pressure 134/60 pulse 60 height 4 foot 11 weight 113. HEENT ;nontraumatic normocephalic EOMI PERRLA sclera anicteric, Supple; no JVD, Cardiovascular; regular rate and rhythm with S1-S2 no murmur or gallop, Pulmonary; clear bilaterally, Abdomen; soft she is tender across the upper abdomen no guarding or rebound no palpable mass or hepatosplenomegaly bowel sounds are present, Rectal; exam not done, Extremities; no clubbing cyanosis or edema skin warm and dry, Psych; mood and affect appropriate       Assessment & Osborn:   #1 69 yo  Female with 5 day hx of upper abdominal pain-pt has transverse colon diverticulosis and  has had similar pain with prior episode of diverticulitis . #2 hx of benign pancreatic cyst  #3 Colon cancer screening- due for follow up colonoscopy this year  Osborn;  Continue Cipro 500 mg BID x 10 days Add florastor BID x 2-3 weeks  Gradually  advance diet. Pt will call if sxs not completely resolved when finishes antibiotic She will call back to schedule colonoscopy with Jessica Osborn in the next few months   Jessica Osborn Jessica Osborn 11/19/2014   Cc: Jessica Freeze, MD

## 2014-11-25 ENCOUNTER — Encounter: Payer: Self-pay | Admitting: Gastroenterology

## 2014-12-22 ENCOUNTER — Other Ambulatory Visit: Payer: Self-pay | Admitting: Nurse Practitioner

## 2014-12-22 DIAGNOSIS — N644 Mastodynia: Secondary | ICD-10-CM

## 2014-12-25 ENCOUNTER — Ambulatory Visit
Admission: RE | Admit: 2014-12-25 | Discharge: 2014-12-25 | Disposition: A | Discharge status: Home / Self Care | Payer: Medicare Other | Source: Ambulatory Visit | Attending: Nurse Practitioner | Admitting: Nurse Practitioner

## 2014-12-25 DIAGNOSIS — N644 Mastodynia: Secondary | ICD-10-CM

## 2015-01-11 ENCOUNTER — Ambulatory Visit (AMBULATORY_SURGERY_CENTER): Payer: Self-pay | Admitting: *Deleted

## 2015-01-11 VITALS — Ht 60.0 in | Wt 116.2 lb

## 2015-01-11 DIAGNOSIS — Z1211 Encounter for screening for malignant neoplasm of colon: Secondary | ICD-10-CM

## 2015-01-11 MED ORDER — NA SULFATE-K SULFATE-MG SULF 17.5-3.13-1.6 GM/177ML PO SOLN: 1.0000 | Freq: Once | ORAL | Status: DC

## 2015-01-11 NOTE — Progress Notes (Signed)
No egg or soy allergy No issues with past sedation No home 02 use No diet pills emmi video declined Discussed prep pricing with pt in PV today . She choose suprep prep as directed

## 2015-01-26 ENCOUNTER — Encounter: Payer: Self-pay | Admitting: Gastroenterology

## 2015-01-26 ENCOUNTER — Other Ambulatory Visit: Payer: Self-pay | Admitting: Gastroenterology

## 2015-01-26 ENCOUNTER — Ambulatory Visit (AMBULATORY_SURGERY_CENTER): Payer: Medicare Other | Admitting: Gastroenterology

## 2015-01-26 VITALS — BP 140/55 | HR 60 | Temp 97.2°F | Resp 124 | Ht 60.0 in | Wt 116.0 lb

## 2015-01-26 DIAGNOSIS — Z1211 Encounter for screening for malignant neoplasm of colon: Secondary | ICD-10-CM

## 2015-01-26 DIAGNOSIS — D120 Benign neoplasm of cecum: Secondary | ICD-10-CM

## 2015-01-26 MED ORDER — SODIUM CHLORIDE 0.9 % IV SOLN: 500.0000 mL | INTRAVENOUS | Status: DC

## 2015-01-26 NOTE — Progress Notes (Signed)
Called to room to assist during endoscopic procedure.  Patient ID and intended procedure confirmed with present staff. Received instructions for my participation in the procedure from the performing physician.  

## 2015-01-26 NOTE — Patient Instructions (Signed)
YOU HAD AN ENDOSCOPIC PROCEDURE TODAY AT [facility]:   Refer to the procedure report that was given to you for any specific questions about what was found during the examination.  If the procedure report does not answer your questions, please call your gastroenterologist to clarify.  If you requested that your care partner not be given the details of your procedure findings, then the procedure report has been included in a sealed envelope for you to review at your convenience later.  YOU SHOULD EXPECT: Some feelings of bloating in the abdomen. Passage of more gas than usual.  Walking can help get rid of the air that was put into your GI tract during the procedure and reduce the bloating. If you had a lower endoscopy (such as a colonoscopy or flexible sigmoidoscopy) you may notice spotting of blood in your stool or on the toilet paper. If you underwent a bowel prep for your procedure, you may not have a normal bowel movement for a few days.  Please Note:  You might notice some irritation and congestion in your nose or some drainage.  This is from the oxygen used during your procedure.  There is no need for concern and it should clear up in a day or so.  SYMPTOMS TO REPORT IMMEDIATELY:   Following lower endoscopy (colonoscopy or flexible sigmoidoscopy):  Excessive amounts of blood in the stool  Significant tenderness or worsening of abdominal pains  Swelling of the abdomen that is new, acute  Fever of 100F or higher   For urgent or emergent issues, a gastroenterologist can be reached at any hour by calling [phone].   DIET: Your first meal following the procedure should be a small meal and then it is ok to progress to your normal diet. Heavy or fried foods are harder to digest and may make you feel nauseous or bloated.  Likewise, meals heavy in dairy and vegetables can increase bloating.  Drink plenty of fluids but you should avoid alcoholic beverages for 24  hours.  ACTIVITY:  You should plan to take it easy for the rest of today and you should NOT DRIVE or use heavy machinery until tomorrow (because of the sedation medicines used during the test).    FOLLOW UP: Our staff will call the number listed on your records the next business day following your procedure to check on you and address any questions or concerns that you may have regarding the information given to you following your procedure. If we do not reach you, we will leave a message.  However, if you are feeling well and you are not experiencing any problems, there is no need to return our call.  We will assume that you have returned to your regular daily activities without incident.  If any biopsies were taken you will be contacted by phone or by letter within the next 1-3 weeks.  Please call us at [phone] if you have not heard about the biopsies in 3 weeks.    SIGNATURES/CONFIDENTIALITY: You and/or your care partner have signed paperwork which will be entered into your electronic medical record.  These signatures attest to the fact that that the information above on your After Visit Summary has been reviewed and is understood.  Full responsibility of the confidentiality of this discharge information lies with you and/or your care-partner.    Handouts were given to your care partner on polyps, diverticulosis, hemorrhoids,and a high fiber diet with liberal fluid intake. You may resume  your current medications today.  Except hold aspirin and NSAIDS medications for 2 weeks. Await biopsy results. Please call if any questions or concerns.

## 2015-01-26 NOTE — Progress Notes (Signed)
No problems noted in the recovery room. maw 

## 2015-01-26 NOTE — Progress Notes (Signed)
Report to PACU, RN, vss, BBS= Clear.  

## 2015-01-26 NOTE — Op Note (Signed)
[place]  [address] [place], [address]   COLONOSCOPY PROCEDURE REPORT  PATIENT: Jessica Osborn, Jessica Osborn  MR#: [id number] BIRTHDATE: 1945-12-21 , 35  yrs. old GENDER: female ENDOSCOPIST: Ladene Artist, MD, [facility] PROCEDURE DATE:  01/26/2015 PROCEDURE:   Colonoscopy, screening and Colonoscopy with snare polypectomy First Screening Colonoscopy - Avg.  risk and is 50 yrs.  old or older - No.  Prior Negative Screening - Now for repeat screening. N/A  History of Adenoma - Now for follow-up colonoscopy & has been > or = to 3 yrs.  N/A  Polyps removed today? Yes ASA CLASS:   Class II INDICATIONS:Screening for colonic neoplasia and Colorectal Neoplasm Risk Assessment for this procedure is average risk. MEDICATIONS: Monitored anesthesia care and Propofol 200 mg IV DESCRIPTION OF PROCEDURE:   After the risks benefits and alternatives of the procedure were thoroughly explained, informed consent was obtained.  The digital rectal exam revealed no abnormalities of the rectum.   The LB [id number]  endoscope was introduced through the anus and advanced to the cecum, which was identified by both the appendix and ileocecal valve. No adverse events experienced.   The quality of the prep was good.  (Suprep was used)  The instrument was then slowly withdrawn as the colon was fully examined.   COLON FINDINGS: A sessile polyp measuring 8 mm in size was found at the cecum.  A polypectomy was performed using snare cautery.  The resection was complete, the polyp tissue was completely retrieved and sent to histology.   There was mild diverticulosis noted in the sigmoid colon.   The examination was otherwise normal.  Retroflexed views revealed internal Grade I hemorrhoids. The time to cecum = 2.7 Withdrawal time = 13.2   The scope was withdrawn and the procedure completed. COMPLICATIONS: There were no immediate complications.  ENDOSCOPIC IMPRESSION: 1.   Sessile polyp at the cecum;  polypectomy performed using snare cautery 2.   Mild diverticulosis in the sigmoid colon 3.   Grade l internal hemorrhoids  RECOMMENDATIONS: 1.  Await pathology results 2.  High fiber diet with liberal fluid intake. 3.  Avoid ASA/NSAIDs for 2 weeks 4.  Repeat colonoscopy in 5 years if polyp adenomatous; otherwise 10 years  eSigned:  Ladene Artist, MD, [facility] 01/26/2015 9:10 AM   cc: Domenick Gong, MD

## 2015-01-27 ENCOUNTER — Telehealth: Payer: Self-pay

## 2015-01-27 NOTE — Telephone Encounter (Signed)
  Follow up Call-  Call back number 01/26/2015  Post procedure Call Back phone  # [phone]  Permission to leave phone message Yes     Patient questions:  Do you have a fever, pain , or abdominal swelling? No. Pain Score  0 *  Have you tolerated food without any problems? Yes.    Have you been able to return to your normal activities? Yes.    Do you have any questions about your discharge instructions: Diet   No. Medications  No. Follow up visit  No.  Do you have questions or concerns about your Care? No.  Actions: * If pain score is 4 or above: No action needed, pain <4.

## 2015-02-03 ENCOUNTER — Encounter: Payer: Self-pay | Admitting: Gastroenterology

## 2015-09-24 ENCOUNTER — Other Ambulatory Visit: Payer: Self-pay

## 2015-09-24 DIAGNOSIS — Z1231 Encounter for screening mammogram for malignant neoplasm of breast: Secondary | ICD-10-CM

## 2015-11-08 ENCOUNTER — Ambulatory Visit
Admission: RE | Admit: 2015-11-08 | Discharge: 2015-11-08 | Disposition: A | Discharge status: Home / Self Care | Payer: Medicare Other | Source: Ambulatory Visit

## 2015-11-08 DIAGNOSIS — Z1231 Encounter for screening mammogram for malignant neoplasm of breast: Secondary | ICD-10-CM | POA: Diagnosis not present

## 2015-12-15 DIAGNOSIS — Z6822 Body mass index (BMI) 22.0-22.9, adult: Secondary | ICD-10-CM | POA: Diagnosis not present

## 2015-12-15 DIAGNOSIS — E559 Vitamin D deficiency, unspecified: Secondary | ICD-10-CM | POA: Diagnosis not present

## 2015-12-15 DIAGNOSIS — E784 Other hyperlipidemia: Secondary | ICD-10-CM | POA: Diagnosis not present

## 2015-12-15 DIAGNOSIS — Z124 Encounter for screening for malignant neoplasm of cervix: Secondary | ICD-10-CM | POA: Diagnosis not present

## 2015-12-15 DIAGNOSIS — Z01419 Encounter for gynecological examination (general) (routine) without abnormal findings: Secondary | ICD-10-CM | POA: Diagnosis not present

## 2015-12-22 DIAGNOSIS — E559 Vitamin D deficiency, unspecified: Secondary | ICD-10-CM | POA: Diagnosis not present

## 2015-12-22 DIAGNOSIS — K5909 Other constipation: Secondary | ICD-10-CM | POA: Diagnosis not present

## 2015-12-22 DIAGNOSIS — I868 Varicose veins of other specified sites: Secondary | ICD-10-CM | POA: Diagnosis not present

## 2015-12-22 DIAGNOSIS — M859 Disorder of bone density and structure, unspecified: Secondary | ICD-10-CM | POA: Diagnosis not present

## 2015-12-22 DIAGNOSIS — Z1389 Encounter for screening for other disorder: Secondary | ICD-10-CM | POA: Diagnosis not present

## 2015-12-22 DIAGNOSIS — E7800 Pure hypercholesterolemia, unspecified: Secondary | ICD-10-CM | POA: Diagnosis not present

## 2015-12-22 DIAGNOSIS — Z6822 Body mass index (BMI) 22.0-22.9, adult: Secondary | ICD-10-CM | POA: Diagnosis not present

## 2015-12-22 DIAGNOSIS — Z0000 Encounter for general adult medical examination without abnormal findings: Secondary | ICD-10-CM | POA: Diagnosis not present

## 2016-01-04 DIAGNOSIS — Z1212 Encounter for screening for malignant neoplasm of rectum: Secondary | ICD-10-CM | POA: Diagnosis not present

## 2016-04-07 ENCOUNTER — Telehealth: Payer: Self-pay

## 2016-04-07 DIAGNOSIS — K869 Disease of pancreas, unspecified: Secondary | ICD-10-CM

## 2016-04-07 NOTE — Telephone Encounter (Signed)
Patient notified it is time to set up MRI follow up for pancreatic lesion.  She prefers [facility].  She is advised that they will call her directly to set up MRI.  She is very busy with wedding plans for her daughter and wants to wait until the end of August or mid-September to schedule.  She is aware that GI will be able to work with her schedule and ok to wait this time frame.

## 2016-04-07 NOTE — Telephone Encounter (Signed)
-----   Message from Kellie Moor, RN sent at 04/07/2014  4:51 PM EDT ----- Needs MRI for pancreatic cyst - see results from 04/07/14

## 2016-04-19 ENCOUNTER — Ambulatory Visit
Admission: RE | Admit: 2016-04-19 | Discharge: 2016-04-19 | Disposition: A | Discharge status: Home / Self Care | Payer: Medicare Other | Source: Ambulatory Visit | Attending: Gastroenterology | Admitting: Gastroenterology

## 2016-04-19 DIAGNOSIS — K869 Disease of pancreas, unspecified: Secondary | ICD-10-CM

## 2016-04-19 DIAGNOSIS — K8689 Other specified diseases of pancreas: Secondary | ICD-10-CM | POA: Diagnosis not present

## 2016-04-19 MED ORDER — GADOBENATE DIMEGLUMINE 529 MG/ML IV SOLN
10.0000 mL | Freq: Once | INTRAVENOUS | Status: AC | PRN
  Administered 2016-04-19: 10 mL via INTRAVENOUS

## 2016-05-08 ENCOUNTER — Other Ambulatory Visit: Payer: Self-pay | Admitting: General Surgery

## 2016-05-08 DIAGNOSIS — K862 Cyst of pancreas: Secondary | ICD-10-CM | POA: Diagnosis not present

## 2016-05-23 DIAGNOSIS — H5213 Myopia, bilateral: Secondary | ICD-10-CM | POA: Diagnosis not present

## 2016-05-27 DIAGNOSIS — Z23 Encounter for immunization: Secondary | ICD-10-CM | POA: Diagnosis not present

## 2016-07-10 DIAGNOSIS — Z79899 Other long term (current) drug therapy: Secondary | ICD-10-CM | POA: Diagnosis not present

## 2016-07-10 DIAGNOSIS — K5710 Diverticulosis of small intestine without perforation or abscess without bleeding: Secondary | ICD-10-CM | POA: Diagnosis not present

## 2016-07-10 DIAGNOSIS — M810 Age-related osteoporosis without current pathological fracture: Secondary | ICD-10-CM | POA: Diagnosis not present

## 2016-07-10 DIAGNOSIS — K862 Cyst of pancreas: Secondary | ICD-10-CM | POA: Diagnosis not present

## 2016-07-10 DIAGNOSIS — D136 Benign neoplasm of pancreas: Secondary | ICD-10-CM | POA: Diagnosis not present

## 2016-07-10 DIAGNOSIS — K5730 Diverticulosis of large intestine without perforation or abscess without bleeding: Secondary | ICD-10-CM | POA: Diagnosis not present

## 2016-07-25 ENCOUNTER — Encounter (HOSPITAL_COMMUNITY): Payer: Medicare Other

## 2016-07-27 ENCOUNTER — Inpatient Hospital Stay: Admit: 2016-07-27 | Payer: Medicare Other | Admitting: General Surgery

## 2016-07-27 SURGERY — PANCREATECTOMY, LAPAROSCOPIC
Anesthesia: Choice | Site: Abdomen

## 2016-09-18 DIAGNOSIS — L218 Other seborrheic dermatitis: Secondary | ICD-10-CM | POA: Diagnosis not present

## 2016-09-18 DIAGNOSIS — D225 Melanocytic nevi of trunk: Secondary | ICD-10-CM | POA: Diagnosis not present

## 2016-09-18 DIAGNOSIS — D1801 Hemangioma of skin and subcutaneous tissue: Secondary | ICD-10-CM | POA: Diagnosis not present

## 2016-09-18 DIAGNOSIS — D2262 Melanocytic nevi of left upper limb, including shoulder: Secondary | ICD-10-CM | POA: Diagnosis not present

## 2016-09-18 DIAGNOSIS — L821 Other seborrheic keratosis: Secondary | ICD-10-CM | POA: Diagnosis not present

## 2016-10-18 ENCOUNTER — Other Ambulatory Visit: Payer: Self-pay | Admitting: Obstetrics and Gynecology

## 2016-10-18 DIAGNOSIS — Z1231 Encounter for screening mammogram for malignant neoplasm of breast: Secondary | ICD-10-CM

## 2016-11-14 ENCOUNTER — Ambulatory Visit
Admission: RE | Admit: 2016-11-14 | Discharge: 2016-11-14 | Disposition: A | Discharge status: Home / Self Care | Payer: Medicare Other | Source: Ambulatory Visit | Attending: Obstetrics and Gynecology | Admitting: Obstetrics and Gynecology

## 2016-11-14 DIAGNOSIS — Z1231 Encounter for screening mammogram for malignant neoplasm of breast: Secondary | ICD-10-CM | POA: Diagnosis not present

## 2016-12-26 DIAGNOSIS — E559 Vitamin D deficiency, unspecified: Secondary | ICD-10-CM | POA: Diagnosis not present

## 2016-12-26 DIAGNOSIS — E7800 Pure hypercholesterolemia, unspecified: Secondary | ICD-10-CM | POA: Diagnosis not present

## 2016-12-26 DIAGNOSIS — M859 Disorder of bone density and structure, unspecified: Secondary | ICD-10-CM | POA: Diagnosis not present

## 2017-02-21 DIAGNOSIS — E559 Vitamin D deficiency, unspecified: Secondary | ICD-10-CM | POA: Diagnosis not present

## 2017-02-21 DIAGNOSIS — Z0000 Encounter for general adult medical examination without abnormal findings: Secondary | ICD-10-CM | POA: Diagnosis not present

## 2017-02-21 DIAGNOSIS — E7800 Pure hypercholesterolemia, unspecified: Secondary | ICD-10-CM | POA: Diagnosis not present

## 2017-02-21 DIAGNOSIS — M859 Disorder of bone density and structure, unspecified: Secondary | ICD-10-CM | POA: Diagnosis not present

## 2017-02-22 DIAGNOSIS — Z1212 Encounter for screening for malignant neoplasm of rectum: Secondary | ICD-10-CM | POA: Diagnosis not present

## 2017-05-29 DIAGNOSIS — H5213 Myopia, bilateral: Secondary | ICD-10-CM | POA: Diagnosis not present

## 2017-06-09 DIAGNOSIS — Z23 Encounter for immunization: Secondary | ICD-10-CM | POA: Diagnosis not present

## 2017-06-25 DIAGNOSIS — Z86018 Personal history of other benign neoplasm: Secondary | ICD-10-CM | POA: Diagnosis not present

## 2017-06-25 DIAGNOSIS — D136 Benign neoplasm of pancreas: Secondary | ICD-10-CM | POA: Diagnosis not present

## 2017-07-17 ENCOUNTER — Other Ambulatory Visit: Payer: Self-pay | Admitting: Internal Medicine

## 2017-07-17 DIAGNOSIS — K5730 Diverticulosis of large intestine without perforation or abscess without bleeding: Secondary | ICD-10-CM

## 2017-07-17 DIAGNOSIS — R1032 Left lower quadrant pain: Secondary | ICD-10-CM | POA: Diagnosis not present

## 2017-07-17 DIAGNOSIS — Z6822 Body mass index (BMI) 22.0-22.9, adult: Secondary | ICD-10-CM | POA: Diagnosis not present

## 2017-07-17 DIAGNOSIS — D136 Benign neoplasm of pancreas: Secondary | ICD-10-CM | POA: Diagnosis not present

## 2017-07-19 ENCOUNTER — Ambulatory Visit
Admission: RE | Admit: 2017-07-19 | Discharge: 2017-07-19 | Disposition: A | Discharge status: Home / Self Care | Payer: Medicare Other | Source: Ambulatory Visit | Attending: Internal Medicine | Admitting: Internal Medicine

## 2017-07-19 DIAGNOSIS — K862 Cyst of pancreas: Secondary | ICD-10-CM | POA: Diagnosis not present

## 2017-07-19 DIAGNOSIS — R1032 Left lower quadrant pain: Secondary | ICD-10-CM

## 2017-07-19 DIAGNOSIS — K5730 Diverticulosis of large intestine without perforation or abscess without bleeding: Secondary | ICD-10-CM

## 2017-07-19 MED ORDER — IOPAMIDOL (ISOVUE-300) INJECTION 61%
100.0000 mL | Freq: Once | INTRAVENOUS | Status: AC | PRN
  Administered 2017-07-19: 100 mL via INTRAVENOUS

## 2017-07-25 ENCOUNTER — Ambulatory Visit: Payer: Medicare Other | Admitting: Nurse Practitioner

## 2017-10-11 ENCOUNTER — Other Ambulatory Visit: Payer: Self-pay | Admitting: Obstetrics and Gynecology

## 2017-10-11 DIAGNOSIS — Z1231 Encounter for screening mammogram for malignant neoplasm of breast: Secondary | ICD-10-CM

## 2017-11-05 DIAGNOSIS — D225 Melanocytic nevi of trunk: Secondary | ICD-10-CM | POA: Diagnosis not present

## 2017-11-05 DIAGNOSIS — D2262 Melanocytic nevi of left upper limb, including shoulder: Secondary | ICD-10-CM | POA: Diagnosis not present

## 2017-11-05 DIAGNOSIS — L821 Other seborrheic keratosis: Secondary | ICD-10-CM | POA: Diagnosis not present

## 2017-11-05 DIAGNOSIS — L814 Other melanin hyperpigmentation: Secondary | ICD-10-CM | POA: Diagnosis not present

## 2017-11-19 ENCOUNTER — Ambulatory Visit
Admission: RE | Admit: 2017-11-19 | Discharge: 2017-11-19 | Disposition: A | Discharge status: Home / Self Care | Payer: Medicare Other | Source: Ambulatory Visit | Attending: Obstetrics and Gynecology | Admitting: Obstetrics and Gynecology

## 2017-11-19 DIAGNOSIS — Z1231 Encounter for screening mammogram for malignant neoplasm of breast: Secondary | ICD-10-CM | POA: Diagnosis not present

## 2018-02-20 DIAGNOSIS — R82998 Other abnormal findings in urine: Secondary | ICD-10-CM | POA: Diagnosis not present

## 2018-02-20 DIAGNOSIS — E7800 Pure hypercholesterolemia, unspecified: Secondary | ICD-10-CM | POA: Diagnosis not present

## 2018-02-20 DIAGNOSIS — E559 Vitamin D deficiency, unspecified: Secondary | ICD-10-CM | POA: Diagnosis not present

## 2018-02-27 DIAGNOSIS — I868 Varicose veins of other specified sites: Secondary | ICD-10-CM | POA: Diagnosis not present

## 2018-02-27 DIAGNOSIS — K5909 Other constipation: Secondary | ICD-10-CM | POA: Diagnosis not present

## 2018-02-27 DIAGNOSIS — E559 Vitamin D deficiency, unspecified: Secondary | ICD-10-CM | POA: Diagnosis not present

## 2018-02-27 DIAGNOSIS — Z0000 Encounter for general adult medical examination without abnormal findings: Secondary | ICD-10-CM | POA: Diagnosis not present

## 2018-02-28 DIAGNOSIS — Z1212 Encounter for screening for malignant neoplasm of rectum: Secondary | ICD-10-CM | POA: Diagnosis not present

## 2018-03-19 DIAGNOSIS — Z01419 Encounter for gynecological examination (general) (routine) without abnormal findings: Secondary | ICD-10-CM | POA: Diagnosis not present

## 2018-03-19 DIAGNOSIS — Z6822 Body mass index (BMI) 22.0-22.9, adult: Secondary | ICD-10-CM | POA: Diagnosis not present

## 2018-04-22 DIAGNOSIS — N361 Urethral diverticulum: Secondary | ICD-10-CM | POA: Diagnosis not present

## 2018-04-25 DIAGNOSIS — M25561 Pain in right knee: Secondary | ICD-10-CM | POA: Diagnosis not present

## 2018-04-25 DIAGNOSIS — M25562 Pain in left knee: Secondary | ICD-10-CM | POA: Diagnosis not present

## 2018-05-25 DIAGNOSIS — Z23 Encounter for immunization: Secondary | ICD-10-CM | POA: Diagnosis not present

## 2018-06-04 DIAGNOSIS — H5213 Myopia, bilateral: Secondary | ICD-10-CM | POA: Diagnosis not present

## 2018-07-08 DIAGNOSIS — M1711 Unilateral primary osteoarthritis, right knee: Secondary | ICD-10-CM | POA: Diagnosis not present

## 2018-07-08 DIAGNOSIS — M25562 Pain in left knee: Secondary | ICD-10-CM | POA: Diagnosis not present

## 2018-07-08 DIAGNOSIS — M170 Bilateral primary osteoarthritis of knee: Secondary | ICD-10-CM | POA: Diagnosis not present

## 2018-07-08 DIAGNOSIS — M25561 Pain in right knee: Secondary | ICD-10-CM | POA: Diagnosis not present

## 2018-10-16 ENCOUNTER — Other Ambulatory Visit: Payer: Self-pay | Admitting: Obstetrics and Gynecology

## 2018-10-16 DIAGNOSIS — Z1231 Encounter for screening mammogram for malignant neoplasm of breast: Secondary | ICD-10-CM

## 2018-11-25 ENCOUNTER — Ambulatory Visit
Admission: RE | Admit: 2018-11-25 | Discharge: 2018-11-25 | Disposition: A | Discharge status: Home / Self Care | Payer: Medicare Other | Source: Ambulatory Visit | Attending: Obstetrics and Gynecology | Admitting: Obstetrics and Gynecology

## 2018-11-25 ENCOUNTER — Other Ambulatory Visit: Payer: Self-pay

## 2018-11-25 DIAGNOSIS — Z1231 Encounter for screening mammogram for malignant neoplasm of breast: Secondary | ICD-10-CM | POA: Diagnosis not present

## 2019-02-25 DIAGNOSIS — M859 Disorder of bone density and structure, unspecified: Secondary | ICD-10-CM | POA: Diagnosis not present

## 2019-02-25 DIAGNOSIS — E7800 Pure hypercholesterolemia, unspecified: Secondary | ICD-10-CM | POA: Diagnosis not present

## 2019-02-25 DIAGNOSIS — Z79899 Other long term (current) drug therapy: Secondary | ICD-10-CM | POA: Diagnosis not present

## 2019-04-01 DIAGNOSIS — N362 Urethral caruncle: Secondary | ICD-10-CM | POA: Diagnosis not present

## 2019-04-01 DIAGNOSIS — Z6823 Body mass index (BMI) 23.0-23.9, adult: Secondary | ICD-10-CM | POA: Diagnosis not present

## 2019-04-01 DIAGNOSIS — Z01419 Encounter for gynecological examination (general) (routine) without abnormal findings: Secondary | ICD-10-CM | POA: Diagnosis not present

## 2019-05-30 DIAGNOSIS — Z23 Encounter for immunization: Secondary | ICD-10-CM | POA: Diagnosis not present

## 2019-06-10 DIAGNOSIS — H524 Presbyopia: Secondary | ICD-10-CM | POA: Diagnosis not present

## 2019-06-10 DIAGNOSIS — H5213 Myopia, bilateral: Secondary | ICD-10-CM | POA: Diagnosis not present

## 2019-06-16 ENCOUNTER — Telehealth: Payer: Self-pay | Admitting: Gastroenterology

## 2019-06-16 NOTE — Telephone Encounter (Signed)
Pt aware and scheduled to see Nicoletta Ba PA tomorrow at 2pm, pt aware.

## 2019-06-16 NOTE — Telephone Encounter (Signed)
Pt states she thinks she is having a diverticulitis flare. Reports she has pain in her RLQ, she feels bloated, no fever but at times she feels warm. This has been going on for about 10 days but today it is worse. States the pain is a constant ache now. Please advise.

## 2019-06-16 NOTE — Telephone Encounter (Signed)
Needs office evaluation with me, APP or PCP If symptoms cannot wait then ED or Urgent Care evaluation sooner

## 2019-06-17 ENCOUNTER — Other Ambulatory Visit (INDEPENDENT_AMBULATORY_CARE_PROVIDER_SITE_OTHER): Payer: Medicare Other

## 2019-06-17 ENCOUNTER — Ambulatory Visit: Payer: Medicare Other | Admitting: Physician Assistant

## 2019-06-17 ENCOUNTER — Encounter: Payer: Self-pay | Admitting: Physician Assistant

## 2019-06-17 VITALS — BP 160/88 | HR 73 | Temp 97.6°F | Ht 60.0 in | Wt 116.0 lb

## 2019-06-17 DIAGNOSIS — R1031 Right lower quadrant pain: Secondary | ICD-10-CM

## 2019-06-17 DIAGNOSIS — R1032 Left lower quadrant pain: Secondary | ICD-10-CM

## 2019-06-17 DIAGNOSIS — K5732 Diverticulitis of large intestine without perforation or abscess without bleeding: Secondary | ICD-10-CM

## 2019-06-17 LAB — CBC WITH DIFFERENTIAL/PLATELET
Basophils Absolute: 0.1 10*3/uL (ref 0.0–0.1)
Basophils Relative: 0.9 % (ref 0.0–3.0)
Eosinophils Absolute: 0.1 10*3/uL (ref 0.0–0.7)
Eosinophils Relative: 0.8 % (ref 0.0–5.0)
HCT: 43.4 % (ref 36.0–46.0)
Hemoglobin: 14.4 g/dL (ref 12.0–15.0)
Lymphocytes Relative: 22.8 % (ref 12.0–46.0)
Lymphs Abs: 1.6 10*3/uL (ref 0.7–4.0)
MCHC: 33.3 g/dL (ref 30.0–36.0)
MCV: 91.6 fl (ref 78.0–100.0)
Monocytes Absolute: 0.5 10*3/uL (ref 0.1–1.0)
Monocytes Relative: 6.6 % (ref 3.0–12.0)
Neutro Abs: 4.8 10*3/uL (ref 1.4–7.7)
Neutrophils Relative %: 68.9 % (ref 43.0–77.0)
Platelets: 223 10*3/uL (ref 150.0–400.0)
RBC: 4.73 Mil/uL (ref 3.87–5.11)
RDW: 13.2 % (ref 11.5–15.5)
WBC: 6.9 10*3/uL (ref 4.0–10.5)

## 2019-06-17 LAB — BASIC METABOLIC PANEL
BUN: 9 mg/dL (ref 6–23)
CO2: 29 mEq/L (ref 19–32)
Calcium: 9.7 mg/dL (ref 8.4–10.5)
Chloride: 103 mEq/L (ref 96–112)
Creatinine, Ser: 0.8 mg/dL (ref 0.40–1.20)
GFR: 70.6 mL/min (ref >60.00)
Glucose, Bld: 84 mg/dL (ref 70–99)
Potassium: 4.2 mEq/L (ref 3.5–5.1)
Sodium: 139 mEq/L (ref 135–145)

## 2019-06-17 LAB — SEDIMENTATION RATE: Sed Rate: 6 mm/hr (ref 0–30)

## 2019-06-17 MED ORDER — METRONIDAZOLE 500 MG PO TABS: 500.0000 mg | ORAL_TABLET | Freq: Two times a day (BID) | ORAL | 0 refills | Status: DC

## 2019-06-17 MED ORDER — CIPROFLOXACIN HCL 500 MG PO TABS: 500.0000 mg | ORAL_TABLET | Freq: Two times a day (BID) | ORAL | 0 refills | Status: DC

## 2019-06-17 NOTE — Progress Notes (Signed)
Reviewed and agree with management plan.  Malcolm T. Stark, MD FACG [facility]  

## 2019-06-17 NOTE — Progress Notes (Signed)
Subjective:    Patient ID: Jessica Osborn, female    DOB: 01-15-1946, 73 y.o.   MRN: [id number]  HPI Jessica Osborn is a pleasant 73 year old white female, known to Dr. Fuller Plan and myself who was last seen here in 2017.  She comes in today with complaints of 10 to 12-day history of lower abdominal discomfort.  She does have history of diverticulitis and was treated for similar complaints in 2017. Patient had colonoscopy in May 2016 with finding of an 8 mm polyp which was a tubular adenoma and noted to have sigmoid diverticulosis. 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  She had EUS in 2013. Last imaging of the abdomen November 2018 showed the multicystic pancreatic mass in the mid body measuring 4.6 x 3.6 cm, similar to prior exam pancreas otherwise within normal limits there is a prominent peripancreatic duodenal diverticulum, and a vague 3 mm nodule in the left lower lobe.  Patient says she has "flareups" 2 or 3 times per year with vague lower abdominal discomfort not always well localized, and associated with a bloated heavy type feeling in her lower abdomen.  She says she feels that stress sometimes aggravates the symptoms.  Usually she will back off on her diet for a few days and symptoms will resolve.  This episode has been present for 10 to 12 days and is persisting.  She has not had any associated fever or chills.  No changes in her bowel habits, no melena or hematochezia.  Her appetite is off a bit and she has been eating very light over the past 2 days primarily applesauce and yogurt.  She denies any nausea or vomiting.  No dysuria.   Review of Systems Pertinent positive and negative review of systems were noted in the above HPI section.  All other review of systems was otherwise negative.  Outpatient Encounter Medications as of 06/17/2019  Medication Sig  . alendronate (FOSAMAX) 70 MG tablet Take 70 mg by mouth once a week. Take with a full glass of  water on an empty stomach.  Marland Kitchen atorvastatin (LIPITOR) 20 MG tablet Take 20 mg by mouth daily.  . bifidobacterium infantis (ALIGN) capsule Take 1 capsule by mouth daily.  . Biotin 5000 MCG TABS Take 1 tablet by mouth daily.  . Calcium Carbonate-Vitamin D 600-400 MG-UNIT per tablet Take 1 tablet by mouth daily.  . Cholecalciferol (VITAMIN D3) 1000 UNITS CAPS Take 1 capsule by mouth daily.  . Magnesium 250 MG TABS Take 1 tablet by mouth daily.  . Polyethylene Glycol 3350 (MIRALAX PO) Take by mouth as needed.  . vitamin E 400 UNIT capsule Take 400 Units by mouth daily.  . ciprofloxacin (CIPRO) 500 MG tablet Take 1 tablet (500 mg total) by mouth 2 (two) times daily.  . metroNIDAZOLE (FLAGYL) 500 MG tablet Take 1 tablet (500 mg total) by mouth 2 (two) times daily.   No facility-administered encounter medications on file as of 06/17/2019.    No Known Allergies Patient Active Problem List   Diagnosis Date Noted  . Diverticulosis of colon without hemorrhage 02/26/2013  . Pancreatic cyst 02/26/2013   Social History   Socioeconomic History  . Marital status: Married    Spouse name: Not on file  . Number of children: 2  . Years of education: Not on file  . Highest education level: Not on file  Occupational History  . Occupation: Agricultural engineer  Social Needs  . Emergency planning/management officer  strain: Not on file  . Food insecurity    Worry: Not on file    Inability: Not on file  . Transportation needs    Medical: Not on file    Non-medical: Not on file  Tobacco Use  . Smoking status: Never Smoker  . Smokeless tobacco: Never Used  Substance and Sexual Activity  . Alcohol use: Yes    Comment: socially  . Drug use: No  . Sexual activity: Not on file  Lifestyle  . Physical activity    Days per week: Not on file    Minutes per session: Not on file  . Stress: Not on file  Relationships  . Social Herbalist on phone: Not on file    Gets together: Not on file    Attends religious service: Not  on file    Active member of club or organization: Not on file    Attends meetings of clubs or organizations: Not on file    Relationship status: Not on file  . Intimate partner violence    Fear of current or ex partner: Not on file    Emotionally abused: Not on file    Physically abused: Not on file    Forced sexual activity: Not on file  Other Topics Concern  . Not on file  Social History Narrative   Occ caffeine     Jessica Osborn's family history includes Atrial fibrillation in her mother; COPD in her father and mother; Cancer in her mother; Emphysema in her father and mother; Macular degeneration in her paternal grandmother; Osteoporosis in her mother; Throat cancer in her mother.      Objective:    Vitals:   06/17/19 1403  BP: (!) 160/88  Pulse: 73  Temp: 97.6 F (36.4 C)    Physical Exam Well-developed well-nourished female/female in no acute distress.  Height, Weight, BMI  HEENT; nontraumatic normocephalic, EOMI, PER R LA, sclera anicteric. Oropharynx; not examined/wearing mask/COVID Neck; supple, no JVD Cardiovascular; regular rate and rhythm with S1-S2, no murmur rub or gallop Pulmonary; Clear bilaterally Abdomen; soft, mild tendeness mid lower abdomen and suprapubic nondistended, no guarding, no palpable mass or hepatosplenomegaly, bowel sounds are active Rectal; not done Skin; benign exam, no jaundice rash or appreciable lesions Extremities; no clubbing cyanosis or edema skin warm and dry Neuro/Psych; alert and oriented x4, grossly nonfocal mood and affect appropriate       Assessment & Plan:   #66 73 year old female with 10 to 12-day history of vague lower abdominal discomfort, described as a heavy bloated uncomfortable feeling.  This is similar to previous episodes of diverticulitis.  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  To be very low risk for  development of malignancy and no further work-up indicated as if notes from October 2018 unless patient had change in symptoms #3 adenomatous colon polyps  Plan CBC with differential, be met, sed rate, We will start a 7-day course of Cipro 500 mg p.o. twice daily and metronidazole 500 mg p.o. twice daily. Soft diet bland diet with gradual advancement. Patient is asked to call back in 1 week with a progress report.  If her symptoms have not resolved after course of antibiotics will proceed with CT of the abdomen and pelvis with contrast.  Jessica Osborn S Jessica Macfarlane PA-C 06/17/2019   Cc: Tisovec, Fransico Him, MD

## 2019-06-17 NOTE — Patient Instructions (Signed)
Your provider has requested that you go to the basement level for lab work before leaving today. Press "B" on the elevator. The lab is located at the first door on the left as you exit the elevator.  We have sent the following medications to your pharmacy for you to pick up at your convenience: Cipro 500 mg twice daily x 7 days Flagyl 500 mg twice daily x 7 day  Call our office next Monday or Tuesday and talk with Beth to let her know how you are feeling.  Please remain on a soft, bland diet and gradually advance once you are feeling better.  If you are age 109 or older, your body mass index should be between 23-30. Your Body mass index is 22.65 kg/m. If this is out of the aforementioned range listed, please consider follow up with your Primary Care Provider.  If you are age 72 or younger, your body mass index should be between 19-25. Your Body mass index is 22.65 kg/m. If this is out of the aformentioned range listed, please consider follow up with your Primary Care Provider.

## 2019-06-25 ENCOUNTER — Telehealth: Payer: Self-pay | Admitting: Physician Assistant

## 2019-06-25 NOTE — Telephone Encounter (Signed)
Pt called to report on condition regarding diverticulitis.

## 2019-06-25 NOTE — Telephone Encounter (Signed)
Reports she is feeling much better. Still has a little bloating but that is improving also.  She will call us if she fails to continue to improve or she acutely worsens.

## 2019-08-05 DIAGNOSIS — R350 Frequency of micturition: Secondary | ICD-10-CM | POA: Diagnosis not present

## 2019-08-05 DIAGNOSIS — R1013 Epigastric pain: Secondary | ICD-10-CM | POA: Diagnosis not present

## 2019-08-05 DIAGNOSIS — K2970 Gastritis, unspecified, without bleeding: Secondary | ICD-10-CM | POA: Diagnosis not present

## 2019-08-05 DIAGNOSIS — J309 Allergic rhinitis, unspecified: Secondary | ICD-10-CM | POA: Diagnosis not present

## 2019-08-26 DIAGNOSIS — L821 Other seborrheic keratosis: Secondary | ICD-10-CM | POA: Diagnosis not present

## 2019-08-26 DIAGNOSIS — D225 Melanocytic nevi of trunk: Secondary | ICD-10-CM | POA: Diagnosis not present

## 2019-08-26 DIAGNOSIS — L820 Inflamed seborrheic keratosis: Secondary | ICD-10-CM | POA: Diagnosis not present

## 2019-08-26 DIAGNOSIS — D2262 Melanocytic nevi of left upper limb, including shoulder: Secondary | ICD-10-CM | POA: Diagnosis not present

## 2019-09-15 DIAGNOSIS — L988 Other specified disorders of the skin and subcutaneous tissue: Secondary | ICD-10-CM | POA: Diagnosis not present

## 2019-09-15 DIAGNOSIS — D485 Neoplasm of uncertain behavior of skin: Secondary | ICD-10-CM | POA: Diagnosis not present

## 2019-09-30 DIAGNOSIS — H6593 Unspecified nonsuppurative otitis media, bilateral: Secondary | ICD-10-CM | POA: Diagnosis not present

## 2019-09-30 DIAGNOSIS — H6983 Other specified disorders of Eustachian tube, bilateral: Secondary | ICD-10-CM | POA: Diagnosis not present

## 2019-09-30 DIAGNOSIS — J309 Allergic rhinitis, unspecified: Secondary | ICD-10-CM | POA: Diagnosis not present

## 2019-09-30 DIAGNOSIS — H9313 Tinnitus, bilateral: Secondary | ICD-10-CM | POA: Diagnosis not present

## 2019-10-09 DIAGNOSIS — H9313 Tinnitus, bilateral: Secondary | ICD-10-CM | POA: Diagnosis not present

## 2019-10-09 DIAGNOSIS — H6983 Other specified disorders of Eustachian tube, bilateral: Secondary | ICD-10-CM | POA: Diagnosis not present

## 2019-10-09 DIAGNOSIS — H6593 Unspecified nonsuppurative otitis media, bilateral: Secondary | ICD-10-CM | POA: Diagnosis not present

## 2019-10-09 DIAGNOSIS — R030 Elevated blood-pressure reading, without diagnosis of hypertension: Secondary | ICD-10-CM | POA: Diagnosis not present

## 2019-10-11 ENCOUNTER — Ambulatory Visit: Payer: Medicare Other

## 2019-10-19 ENCOUNTER — Ambulatory Visit: Payer: Medicare Other

## 2019-10-22 ENCOUNTER — Other Ambulatory Visit: Payer: Self-pay | Admitting: Obstetrics and Gynecology

## 2019-10-22 ENCOUNTER — Ambulatory Visit: Payer: Medicare Other

## 2019-10-22 DIAGNOSIS — Z1231 Encounter for screening mammogram for malignant neoplasm of breast: Secondary | ICD-10-CM

## 2019-11-21 DIAGNOSIS — H9313 Tinnitus, bilateral: Secondary | ICD-10-CM | POA: Diagnosis not present

## 2019-11-21 DIAGNOSIS — H9113 Presbycusis, bilateral: Secondary | ICD-10-CM | POA: Diagnosis not present

## 2019-11-21 DIAGNOSIS — H906 Mixed conductive and sensorineural hearing loss, bilateral: Secondary | ICD-10-CM | POA: Diagnosis not present

## 2019-12-18 ENCOUNTER — Ambulatory Visit: Payer: Medicare Other

## 2019-12-30 ENCOUNTER — Other Ambulatory Visit: Payer: Self-pay

## 2019-12-30 ENCOUNTER — Ambulatory Visit
Admission: RE | Admit: 2019-12-30 | Discharge: 2019-12-30 | Disposition: A | Discharge status: Home / Self Care | Payer: Medicare Other | Source: Ambulatory Visit | Attending: Obstetrics and Gynecology | Admitting: Obstetrics and Gynecology

## 2019-12-30 DIAGNOSIS — Z1231 Encounter for screening mammogram for malignant neoplasm of breast: Secondary | ICD-10-CM

## 2020-02-04 ENCOUNTER — Encounter: Payer: Self-pay | Admitting: Gastroenterology

## 2020-02-24 DIAGNOSIS — Z0000 Encounter for general adult medical examination without abnormal findings: Secondary | ICD-10-CM | POA: Diagnosis not present

## 2020-02-24 DIAGNOSIS — E7800 Pure hypercholesterolemia, unspecified: Secondary | ICD-10-CM | POA: Diagnosis not present

## 2020-02-24 DIAGNOSIS — M859 Disorder of bone density and structure, unspecified: Secondary | ICD-10-CM | POA: Diagnosis not present

## 2020-03-08 DIAGNOSIS — R82998 Other abnormal findings in urine: Secondary | ICD-10-CM | POA: Diagnosis not present

## 2020-03-10 DIAGNOSIS — Z1212 Encounter for screening for malignant neoplasm of rectum: Secondary | ICD-10-CM | POA: Diagnosis not present

## 2020-03-17 ENCOUNTER — Ambulatory Visit (AMBULATORY_SURGERY_CENTER): Payer: Self-pay | Admitting: *Deleted

## 2020-03-17 ENCOUNTER — Other Ambulatory Visit: Payer: Self-pay

## 2020-03-17 VITALS — Ht 60.0 in | Wt 113.0 lb

## 2020-03-17 DIAGNOSIS — Z86010 Personal history of colonic polyps: Secondary | ICD-10-CM

## 2020-03-17 MED ORDER — SUTAB 1479-225-188 MG PO TABS: 1.0000 | ORAL_TABLET | ORAL | 0 refills | Status: DC

## 2020-03-17 NOTE — Progress Notes (Signed)
Patient is here in-person for PV. Patient denies any allergies to eggs or soy. Patient denies any problems with anesthesia/sedation. Patient denies any oxygen use at home. Patient denies taking any diet/weight loss medications or blood thinners. Patient is not being treated for MRSA or C-diff. Patient is aware of our care-partner policy and [id number] safety protocol.   COVID-19 vaccines completed in march 2021 per pt.  Prep Prescription coupon given to the patient.

## 2020-03-19 ENCOUNTER — Encounter: Payer: Self-pay | Admitting: Gastroenterology

## 2020-03-31 ENCOUNTER — Ambulatory Visit (AMBULATORY_SURGERY_CENTER): Payer: Medicare Other | Admitting: Gastroenterology

## 2020-03-31 ENCOUNTER — Encounter: Payer: Self-pay | Admitting: Gastroenterology

## 2020-03-31 VITALS — BP 109/48 | HR 53 | Temp 97.3°F | Resp 13 | Ht 60.0 in | Wt 113.0 lb

## 2020-03-31 DIAGNOSIS — Z86010 Personal history of colonic polyps: Secondary | ICD-10-CM

## 2020-03-31 DIAGNOSIS — Z1211 Encounter for screening for malignant neoplasm of colon: Secondary | ICD-10-CM | POA: Diagnosis not present

## 2020-03-31 MED ORDER — SODIUM CHLORIDE 0.9 % IV SOLN: 500.0000 mL | Freq: Once | INTRAVENOUS | Status: DC

## 2020-03-31 NOTE — Op Note (Signed)
[place] Patient Name: Jessica Osborn Procedure Date: 03/31/2020 10:18 AM MRN: [id number] Endoscopist: Ladene Artist , MD Age: 74 Referring MD:  Date of Birth: 1946/06/14 Gender: Female Account #: [id number] Procedure:                Colonoscopy Indications:              Surveillance: Personal history of adenomatous                            polyps on last colonoscopy 5 years ago Medicines:                Monitored Anesthesia Care Procedure:                Pre-Anesthesia Assessment:                           - Prior to the procedure, a History and Physical                            was performed, and patient medications and                            allergies were reviewed. The patient's tolerance of                            previous anesthesia was also reviewed. The risks                            and benefits of the procedure and the sedation                            options and risks were discussed with the patient.                            All questions were answered, and informed consent                            was obtained. Prior Anticoagulants: The patient has                            taken no previous anticoagulant or antiplatelet                            agents. ASA Grade Assessment: II - A patient with                            mild systemic disease. After reviewing the risks                            and benefits, the patient was deemed in                            satisfactory condition to undergo the procedure.  After obtaining informed consent, the colonoscope                            was passed under direct vision. Throughout the                            procedure, the patient's blood pressure, pulse, and                            oxygen saturations were monitored continuously. The                            Colonoscope was introduced through the anus and                            advanced to the the cecum,  identified by                            appendiceal orifice and ileocecal valve. The                            ileocecal valve, appendiceal orifice, and rectum                            were photographed. The quality of the bowel                            preparation was excellent. The colonoscopy was                            performed without difficulty. The patient tolerated                            the procedure well. Scope In: 10:40:52 AM Scope Out: 10:54:34 AM Scope Withdrawal Time: 0 hours 9 minutes 49 seconds  Total Procedure Duration: 0 hours 13 minutes 42 seconds  Findings:                 The perianal and digital rectal examinations were                            normal.                           Scattered medium-mouthed diverticula were found in                            the entire colon. There was no evidence of                            diverticular bleeding.                           External and internal hemorrhoids were found during  retroflexion. The hemorrhoids were small and Grade                            I (internal hemorrhoids that do not prolapse).                           The exam was otherwise without abnormality on                            direct and retroflexion views. Complications:            No immediate complications. Estimated blood loss:                            None. Estimated Blood Loss:     Estimated blood loss: none. Impression:               - Mild diverticulosis in the entire examined colon.                           - External and internal hemorrhoids.                           - The examination was otherwise normal on direct                            and retroflexion views.                           - No specimens collected. Recommendation:           - Consider repeat colonoscopy in 7 years for                            surveillance depending on overall health.                           - Patient has  a contact number available for                            emergencies. The signs and symptoms of potential                            delayed complications were discussed with the                            patient. Return to normal activities tomorrow.                            Written discharge instructions were provided to the                            patient.                           - Resume previous diet.                           -  Continue present medications. Ladene Artist, MD 03/31/2020 10:58:00 AM This report has been signed electronically.

## 2020-03-31 NOTE — Progress Notes (Signed)
To PACU, VSS. Report to [email or web address] 

## 2020-03-31 NOTE — Progress Notes (Signed)
VS by CW  Pt's states no medical or surgical changes since previsit or office visit.  

## 2020-03-31 NOTE — Patient Instructions (Signed)
Handouts given for Diverticulosis and Hemorrhoids.  YOU HAD AN ENDOSCOPIC PROCEDURE TODAY AT [facility]:   Refer to the procedure report that was given to you for any specific questions about what was found during the examination.  If the procedure report does not answer your questions, please call your gastroenterologist to clarify.  If you requested that your care partner not be given the details of your procedure findings, then the procedure report has been included in a sealed envelope for you to review at your convenience later.  YOU SHOULD EXPECT: Some feelings of bloating in the abdomen. Passage of more gas than usual.  Walking can help get rid of the air that was put into your GI tract during the procedure and reduce the bloating. If you had a lower endoscopy (such as a colonoscopy or flexible sigmoidoscopy) you may notice spotting of blood in your stool or on the toilet paper. If you underwent a bowel prep for your procedure, you may not have a normal bowel movement for a few days.  Please Note:  You might notice some irritation and congestion in your nose or some drainage.  This is from the oxygen used during your procedure.  There is no need for concern and it should clear up in a day or so.  SYMPTOMS TO REPORT IMMEDIATELY:   Following lower endoscopy (colonoscopy or flexible sigmoidoscopy):  Excessive amounts of blood in the stool  Significant tenderness or worsening of abdominal pains  Swelling of the abdomen that is new, acute  Fever of 100F or higher  For urgent or emergent issues, a gastroenterologist can be reached at any hour by calling [phone]. Do not use MyChart messaging for urgent concerns.    DIET:  We do recommend a small meal at first, but then you may proceed to your regular diet.  Drink plenty of fluids but you should avoid alcoholic beverages for 24 hours.  ACTIVITY:  You should plan to take it easy for the rest of today and you should NOT  DRIVE or use heavy machinery until tomorrow (because of the sedation medicines used during the test).    FOLLOW UP: Our staff will call the number listed on your records 48-72 hours following your procedure to check on you and address any questions or concerns that you may have regarding the information given to you following your procedure. If we do not reach you, we will leave a message.  We will attempt to reach you two times.  During this call, we will ask if you have developed any symptoms of COVID 19. If you develop any symptoms (ie: fever, flu-like symptoms, shortness of breath, cough etc.) before then, please call [phone].  If you test positive for Covid 19 in the 2 weeks post procedure, please call and report this information to [place].    If any biopsies were taken you will be contacted by phone or by letter within the next 1-3 weeks.  Please call us at [phone] if you have not heard about the biopsies in 3 weeks.    SIGNATURES/CONFIDENTIALITY: You and/or your care partner have signed paperwork which will be entered into your electronic medical record.  These signatures attest to the fact that that the information above on your After Visit Summary has been reviewed and is understood.  Full responsibility of the confidentiality of this discharge information lies with you and/or your care-partner.

## 2020-04-02 ENCOUNTER — Telehealth: Payer: Self-pay | Admitting: *Deleted

## 2020-04-02 NOTE — Telephone Encounter (Signed)
  Follow up Call-  Call back number 03/31/2020  Post procedure Call Back phone  # [phone]  Permission to leave phone message Yes  Some recent data might be hidden     Patient questions:  Do you have a fever, pain , or abdominal swelling? No. Pain Score  0 *  Have you tolerated food without any problems? Yes.    Have you been able to return to your normal activities? Yes.    Do you have any questions about your discharge instructions: Diet   No. Medications  No. Follow up visit  No.  Do you have questions or concerns about your Care? No.  Actions: * If pain score is 4 or above: No action needed, pain <4.  1. Have you developed a fever since your procedure? no  2.   Have you had an respiratory symptoms (SOB or cough) since your procedure? no  3.   Have you tested positive for COVID 19 since your procedure no  4.   Have you had any family members/close contacts diagnosed with the COVID 19 since your procedure?  no   If yes to any of these questions please route to Joylene John, RN and Erenest Rasher, RN

## 2020-04-06 DIAGNOSIS — Z6822 Body mass index (BMI) 22.0-22.9, adult: Secondary | ICD-10-CM | POA: Diagnosis not present

## 2020-04-06 DIAGNOSIS — Z01419 Encounter for gynecological examination (general) (routine) without abnormal findings: Secondary | ICD-10-CM | POA: Diagnosis not present

## 2020-04-06 DIAGNOSIS — Z124 Encounter for screening for malignant neoplasm of cervix: Secondary | ICD-10-CM | POA: Diagnosis not present

## 2020-04-06 DIAGNOSIS — N362 Urethral caruncle: Secondary | ICD-10-CM | POA: Diagnosis not present

## 2020-06-12 DIAGNOSIS — Z23 Encounter for immunization: Secondary | ICD-10-CM | POA: Diagnosis not present

## 2020-06-14 DIAGNOSIS — R12 Heartburn: Secondary | ICD-10-CM | POA: Diagnosis not present

## 2020-06-14 DIAGNOSIS — I493 Ventricular premature depolarization: Secondary | ICD-10-CM | POA: Diagnosis not present

## 2020-06-14 DIAGNOSIS — R0600 Dyspnea, unspecified: Secondary | ICD-10-CM | POA: Diagnosis not present

## 2020-06-14 DIAGNOSIS — R001 Bradycardia, unspecified: Secondary | ICD-10-CM | POA: Diagnosis not present

## 2020-06-15 DIAGNOSIS — H5213 Myopia, bilateral: Secondary | ICD-10-CM | POA: Diagnosis not present

## 2020-07-05 NOTE — Progress Notes (Signed)
Cardiology Office Note:    Date:  07/07/2020   ID:  Jessica Osborn, DOB July 21, 1946, MRN [id number]  PCP:  Haywood Pao, MD  [facility] Cardiologist:  Freada Bergeron, MD  [facility] Electrophysiologist:  None   Referring MD: Janus Molder, NP    History of Present Illness:    Jessica Osborn is a 74 y.o. female with a hx of HLD, diverticulosis, and arthritis who was referred to clinic by Janus Molder, NP for evaluation of worsening DOE.   Patient reports having 6-7 weeks of dyspnea on exertion usually noted when walking the stairs or after long distances at a faster pace. She has to stop and catch her breath and then is able to continue. Feels like this has been worse since the summer. Not present with routine ADLs or slow paced walking. No associated chest pain, SOB at rest, orthopnea, LE edema, or palpitations. No known history of CAD. She is otherwise healthy and very active. Walks 4-5times per week and does yoga.  Family history notable for father with emphysema (smoked) and mother with emphysema, Afib.  Labs: Na 139, Cr 0.8, HgB 14.4, Plt 223, Cr 0.9 TC 196, TG 70, HDL 81, LDL 101  Past Medical History:  Diagnosis Date  . Allergy   . Arthritis    neck,back  . Bradycardia   . Cardiomegaly   . Chest pain   . Constipation    history of--miralax PRN only   . Diverticulosis   . Dyspnea on exertion   . Hyperlipidemia    on meds  . Jaw pain   . Orthopnea   . Osteopenia   . Pancreatic cyst   . Pancreatic cyst   . PVC (premature ventricular contraction)   . SOB (shortness of breath)   . Vitamin D deficiency     Past Surgical History:  Procedure Laterality Date  . BREAST BIOPSY     Right-Benign  . COLONOSCOPY  12-30-2004   tics  . EUS  11/09/2011   Procedure: UPPER ENDOSCOPIC ULTRASOUND (EUS) LINEAR;  Surgeon: Owens Loffler, MD;  Location: [facility];  Service: Endoscopy;  Laterality: N/A;  radial linear   . FINE NEEDLE ASPIRATION  11/09/2011    Procedure: FINE NEEDLE ASPIRATION (FNA) LINEAR;  Surgeon: Owens Loffler, MD;  Location: [facility];  Service: Endoscopy;;  . TONSILLECTOMY      Current Medications: Current Meds  Medication Sig  . alendronate (FOSAMAX) 70 MG tablet Take 70 mg by mouth once a week. Take with a full glass of water on an empty stomach.  Marland Kitchen atorvastatin (LIPITOR) 20 MG tablet Take 20 mg by mouth daily.  . bifidobacterium infantis (ALIGN) capsule Take 1 capsule by mouth daily.  . Biotin 5000 MCG TABS Take 1 tablet by mouth daily.  . Calcium Carbonate-Vitamin D 600-400 MG-UNIT per tablet Take 1 tablet by mouth daily.  . Cholecalciferol (VITAMIN D3) 1000 UNITS CAPS Take 1 capsule by mouth daily.  . fexofenadine (ALLEGRA) 180 MG tablet Take 180 mg by mouth daily.  . fluticasone (FLONASE) 50 MCG/ACT nasal spray Place into both nostrils.  . Magnesium 250 MG TABS Take 1 tablet by mouth daily.  . Polyethylene Glycol 3350 (MIRALAX PO) Take by mouth.  . vitamin E 400 UNIT capsule Take 400 Units by mouth daily.     Allergies:   Patient has no known allergies.   Social History   Socioeconomic History  . Marital status: Married    Spouse name: Not on  file  . Number of children: 2  . Years of education: Not on file  . Highest education level: Not on file  Occupational History  . Occupation: Homemaker  Tobacco Use  . Smoking status: Never Smoker  . Smokeless tobacco: Never Used  Vaping Use  . Vaping Use: Never used  Substance and Sexual Activity  . Alcohol use: Yes    Comment: socially  . Drug use: No  . Sexual activity: Not on file  Other Topics Concern  . Not on file  Social History Narrative   Occ caffeine    Social Determinants of Health   Financial Resource Strain:   . Difficulty of Paying Living Expenses: Not on file  Food Insecurity:   . Worried About Charity fundraiser in the Last Year: Not on file  . Ran Out of Food in the Last Year: Not on file  Transportation Needs:   . Lack of  Transportation (Medical): Not on file  . Lack of Transportation (Non-Medical): Not on file  Physical Activity:   . Days of Exercise per Week: Not on file  . Minutes of Exercise per Session: Not on file  Stress:   . Feeling of Stress : Not on file  Social Connections:   . Frequency of Communication with Friends and Family: Not on file  . Frequency of Social Gatherings with Friends and Family: Not on file  . Attends Religious Services: Not on file  . Active Member of Clubs or Organizations: Not on file  . Attends Archivist Meetings: Not on file  . Marital Status: Not on file     Family History: The patient's family history includes Atrial fibrillation in her mother; COPD in her father and mother; Cancer in her mother; Emphysema in her father and mother; Macular degeneration in her paternal grandmother; Osteoporosis in her mother; Throat cancer in her mother. There is no history of Colon cancer, Rectal cancer, Stomach cancer, Esophageal cancer, or Colon polyps.  ROS:   Please see the history of present illness.    Review of Systems  Constitutional: Negative for chills and fever.  HENT: Negative for sore throat.   Eyes: Negative for blurred vision.  Respiratory: Positive for shortness of breath. Negative for cough.   Cardiovascular: Negative for chest pain, palpitations, orthopnea, claudication, leg swelling and PND.  Gastrointestinal: Negative for abdominal pain, blood in stool, nausea and vomiting.  Genitourinary: Negative for hematuria.  Musculoskeletal: Negative for falls.  Neurological: Negative for dizziness and loss of consciousness.  Endo/Heme/Allergies: Does not bruise/bleed easily.  Psychiatric/Behavioral: Negative for depression.    EKGs/Labs/Other Studies Reviewed:    The following studies were reviewed today: CT abdomen/pelvis 2018: FINDINGS: Lower chest: Lung bases are free of acute infiltrate or sizable effusion. A vague 3 mm nodule is noted in the left  lower lobe laterally on image number 11 of series 4.  Hepatobiliary: Small hypodensity is noted within the right lobe of the liver with peripheral enhancement consistent with an hemangioma. This is stable from the prior exams. No gallstones, gallbladder wall thickening, or biliary dilatation.  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 This is similar to that seen on the prior exam. The pancreas is otherwise within normal limits.  Spleen: Normal in size without focal abnormality.  Adrenals/Urinary Tract: The adrenal glands are unremarkable. The kidneys are well visualized bilaterally with small hypodensities consistent with cysts.  No significant enhancement is noted. Bladder is well distended.  Stomach/Bowel: No obstructive or inflammatory changes are noted. The appendix is well visualized and within normal limits. Prominent peripancreatic duodenal diverticulum is noted.  Vascular/Lymphatic: Aortic atherosclerosis. No enlarged abdominal or pelvic lymph nodes.  Reproductive: The uterus is well visualized and again demonstrates a focal hypodensity likely representing a uterine fibroid. It measures approximately 1.5 cm and is roughly stable from the prior study.  Other: No abdominal wall hernia or abnormality. No abdominopelvic ascites.  Musculoskeletal: Degenerative changes of lumbar spine are noted.  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  Vague 3 mm nodule in the left lower lobe. No follow-up needed if patient is low-risk. Non-contrast chest CT can be considered in 12 months if patient is high-risk. This recommendation follows the consensus statement:   EKG:  EKG is ordered today.  The ekg ordered today demonstrates sinus bradycardia with HR 58. Borderline LAD. No ischemic  changes. No block.  Recent Labs: No results found for requested labs within last 8760 hours.  Recent Lipid Panel No results found for: CHOL, TRIG, HDL, CHOLHDL, VLDL, LDLCALC, LDLDIRECT    Physical Exam:    VS:  BP 112/70   Pulse (!) 58   Ht 5' (1.524 m)   Wt 112 lb (50.8 kg)   SpO2 98%   BMI 21.87 kg/m     Wt Readings from Last 3 Encounters:  07/07/20 112 lb (50.8 kg)  03/31/20 113 lb (51.3 kg)  03/17/20 113 lb (51.3 kg)     GEN:  Well nourished, well developed in no acute distress HEENT: Normal NECK: No JVD; No carotid bruits LYMPHATICS: No lymphadenopathy CARDIAC: RRR, no murmurs, rubs, gallops RESPIRATORY:  Clear to auscultation without rales, wheezing or rhonchi  ABDOMEN: Soft, non-tender, non-distended MUSCULOSKELETAL:  No edema; No deformity  SKIN: Warm and dry NEUROLOGIC:  Alert and oriented x 3 PSYCHIATRIC:  Normal affect   ASSESSMENT:    1. SOB (shortness of breath)   2. SOB (shortness of breath) on exertion   3. Pure hypercholesterolemia    PLAN:    In order of problems listed above:  #DOE: Patient with several week history of progressive dyspnea on exertion. No associated chest pain/pressur, nausea, diaphoresis, or palpitations. No known history of CAD. No TTE/ischemic work-up in our system. Risk factors include age and HLD.  -Obtain exercise ecg stress test -Check TTE -No anemia; TSH monitored by PCP and has been normal  #HLD: Well controlled. Managed by PCP. -Continue atorvastatin 20mg  daily  Medication Adjustments/Labs and Tests Ordered: Current medicines are reviewed at length with the patient today.  Concerns regarding medicines are outlined above.  Orders Placed This Encounter  Procedures  . Exercise Tolerance Test  . EKG 12-Lead  . ECHOCARDIOGRAM COMPLETE   No orders of the defined types were placed in this encounter.   Patient Instructions  Medication Instructions:  Your physician recommends that you continue on your current  medications as directed. Please refer to the Current Medication list given to you today. *If you need a refill on your cardiac medications before your next appointment, please call your pharmacy*   Lab Work: None If you have labs (blood work) drawn today and your tests are completely normal, you will receive your results only by: Marland Kitchen MyChart Message (if you have MyChart) OR . A paper copy in the mail If you have any lab test that is abnormal or we need to  change your treatment, we will call you to review the results.   Testing/Procedures: Your physician has requested that you have an echocardiogram. Echocardiography is a painless test that uses sound waves to create images of your heart. It provides your doctor with information about the size and shape of your heart and how well your heart's chambers and valves are working. This procedure takes approximately one hour. There are no restrictions for this procedure.  Your physician has requested that you have an exercise tolerance test. For further information please visit [email or web address]. Please also follow instruction sheet, as given.  Due to recent COVID-19 restrictions implemented by our local and state authorities and in an effort to keep both patients and staff as safe as possible, our hospital system requires COVID-19 testing prior to certain scheduled hospital procedures.  Please go to [address]. [place], [place] [address]. This is a drive up testing site.  You will not need to exit your vehicle.  You will not be billed at the time of testing but may receive a bill later depending on your insurance. You must agree to self-quarantine from the time of your testing until the procedure date.  This should included staying home with ONLY the people you live with.  Avoid take-out, grocery store shopping or leaving the house for any non-emergent reason.  Failure to have your COVID-19 test done on the date and time you have been scheduled will  result in cancellation of your procedure.  Please call our office at [phone] if you have any questions.       Follow-Up: At [facility], you and your health needs are our priority.  As part of our continuing mission to provide you with exceptional heart care, we have created designated Provider Care Teams.  These Care Teams include your primary Cardiologist (physician) and Advanced Practice Providers (APPs -  Physician Assistants and Nurse Practitioners) who all work together to provide you with the care you need, when you need it.    Your next appointment:   1 year(s)  The format for your next appointment:   In Person  Provider:   Gwyndolyn Kaufman, MD        Signed, Freada Bergeron, MD  07/07/2020 1:01 PM    [place]

## 2020-07-07 ENCOUNTER — Ambulatory Visit: Payer: Medicare Other | Admitting: Cardiology

## 2020-07-07 ENCOUNTER — Other Ambulatory Visit: Payer: Self-pay

## 2020-07-07 ENCOUNTER — Encounter: Payer: Self-pay | Admitting: Cardiology

## 2020-07-07 VITALS — BP 112/70 | HR 58 | Ht 60.0 in | Wt 112.0 lb

## 2020-07-07 DIAGNOSIS — E7800 Pure hypercholesterolemia, unspecified: Secondary | ICD-10-CM | POA: Diagnosis not present

## 2020-07-07 DIAGNOSIS — R0602 Shortness of breath: Secondary | ICD-10-CM | POA: Diagnosis not present

## 2020-07-07 NOTE — Patient Instructions (Addendum)
Medication Instructions:  Your physician recommends that you continue on your current medications as directed. Please refer to the Current Medication list given to you today. *If you need a refill on your cardiac medications before your next appointment, please call your pharmacy*   Lab Work: None If you have labs (blood work) drawn today and your tests are completely normal, you will receive your results only by: Marland Kitchen MyChart Message (if you have MyChart) OR . A paper copy in the mail If you have any lab test that is abnormal or we need to change your treatment, we will call you to review the results.   Testing/Procedures: Your physician has requested that you have an echocardiogram. Echocardiography is a painless test that uses sound waves to create images of your heart. It provides your doctor with information about the size and shape of your heart and how well your heart's chambers and valves are working. This procedure takes approximately one hour. There are no restrictions for this procedure.  Your physician has requested that you have an exercise tolerance test. For further information please visit [email or web address]. Please also follow instruction sheet, as given.  Due to recent COVID-19 restrictions implemented by our local and state authorities and in an effort to keep both patients and staff as safe as possible, our hospital system requires COVID-19 testing prior to certain scheduled hospital procedures.  Please go to [address]. [place], [place] [address]. This is a drive up testing site.  You will not need to exit your vehicle.  You will not be billed at the time of testing but may receive a bill later depending on your insurance. You must agree to self-quarantine from the time of your testing until the procedure date.  This should included staying home with ONLY the people you live with.  Avoid take-out, grocery store shopping or leaving the house for any non-emergent reason.   Failure to have your COVID-19 test done on the date and time you have been scheduled will result in cancellation of your procedure.  Please call our office at [phone] if you have any questions.       Follow-Up: At [facility], you and your health needs are our priority.  As part of our continuing mission to provide you with exceptional heart care, we have created designated Provider Care Teams.  These Care Teams include your primary Cardiologist (physician) and Advanced Practice Providers (APPs -  Physician Assistants and Nurse Practitioners) who all work together to provide you with the care you need, when you need it.    Your next appointment:   1 year(s)  The format for your next appointment:   In Person  Provider:   Gwyndolyn Kaufman, MD

## 2020-07-10 DIAGNOSIS — Z20822 Contact with and (suspected) exposure to covid-19: Secondary | ICD-10-CM | POA: Diagnosis not present

## 2020-08-16 ENCOUNTER — Telehealth (HOSPITAL_COMMUNITY): Payer: Self-pay | Admitting: Cardiology

## 2020-08-16 NOTE — Telephone Encounter (Signed)
Patient called and cancelled GXT at [facility] and echocardiogram due to a knee injury and did not wish to reschedule at this time. Echo order will be removed from the [facility] and if patient calls back to reschedule we will reinstate the order.

## 2020-08-20 ENCOUNTER — Telehealth: Payer: Self-pay | Admitting: Cardiology

## 2020-08-20 ENCOUNTER — Other Ambulatory Visit (HOSPITAL_COMMUNITY): Payer: Medicare Other

## 2020-08-24 ENCOUNTER — Other Ambulatory Visit (HOSPITAL_COMMUNITY): Payer: Medicare Other

## 2020-09-20 DIAGNOSIS — D0362 Melanoma in situ of left upper limb, including shoulder: Secondary | ICD-10-CM | POA: Diagnosis not present

## 2020-09-20 DIAGNOSIS — L821 Other seborrheic keratosis: Secondary | ICD-10-CM | POA: Diagnosis not present

## 2020-09-20 DIAGNOSIS — D2262 Melanocytic nevi of left upper limb, including shoulder: Secondary | ICD-10-CM | POA: Diagnosis not present

## 2020-09-20 DIAGNOSIS — D225 Melanocytic nevi of trunk: Secondary | ICD-10-CM | POA: Diagnosis not present

## 2020-09-20 DIAGNOSIS — L814 Other melanin hyperpigmentation: Secondary | ICD-10-CM | POA: Diagnosis not present

## 2020-10-13 DIAGNOSIS — L988 Other specified disorders of the skin and subcutaneous tissue: Secondary | ICD-10-CM | POA: Diagnosis not present

## 2020-10-13 DIAGNOSIS — D0362 Melanoma in situ of left upper limb, including shoulder: Secondary | ICD-10-CM | POA: Diagnosis not present

## 2020-11-09 DIAGNOSIS — H5711 Ocular pain, right eye: Secondary | ICD-10-CM | POA: Diagnosis not present

## 2020-11-09 DIAGNOSIS — H04123 Dry eye syndrome of bilateral lacrimal glands: Secondary | ICD-10-CM | POA: Diagnosis not present

## 2020-11-09 DIAGNOSIS — H2513 Age-related nuclear cataract, bilateral: Secondary | ICD-10-CM | POA: Diagnosis not present

## 2020-11-09 DIAGNOSIS — H43813 Vitreous degeneration, bilateral: Secondary | ICD-10-CM | POA: Diagnosis not present

## 2020-11-23 ENCOUNTER — Other Ambulatory Visit: Payer: Self-pay | Admitting: Obstetrics and Gynecology

## 2020-11-23 DIAGNOSIS — Z1231 Encounter for screening mammogram for malignant neoplasm of breast: Secondary | ICD-10-CM

## 2020-12-29 DIAGNOSIS — D225 Melanocytic nevi of trunk: Secondary | ICD-10-CM | POA: Diagnosis not present

## 2020-12-29 DIAGNOSIS — L821 Other seborrheic keratosis: Secondary | ICD-10-CM | POA: Diagnosis not present

## 2020-12-29 DIAGNOSIS — D2262 Melanocytic nevi of left upper limb, including shoulder: Secondary | ICD-10-CM | POA: Diagnosis not present

## 2020-12-29 DIAGNOSIS — L570 Actinic keratosis: Secondary | ICD-10-CM | POA: Diagnosis not present

## 2020-12-29 DIAGNOSIS — L7211 Pilar cyst: Secondary | ICD-10-CM | POA: Diagnosis not present

## 2021-01-12 ENCOUNTER — Ambulatory Visit
Admission: RE | Admit: 2021-01-12 | Discharge: 2021-01-12 | Disposition: A | Discharge status: Home / Self Care | Payer: Medicare Other | Source: Ambulatory Visit

## 2021-01-12 ENCOUNTER — Other Ambulatory Visit: Payer: Self-pay

## 2021-01-12 DIAGNOSIS — Z1231 Encounter for screening mammogram for malignant neoplasm of breast: Secondary | ICD-10-CM | POA: Diagnosis not present

## 2021-01-16 DIAGNOSIS — Z20822 Contact with and (suspected) exposure to covid-19: Secondary | ICD-10-CM | POA: Diagnosis not present

## 2021-01-18 DIAGNOSIS — R0981 Nasal congestion: Secondary | ICD-10-CM | POA: Diagnosis not present

## 2021-01-18 DIAGNOSIS — J309 Allergic rhinitis, unspecified: Secondary | ICD-10-CM | POA: Diagnosis not present

## 2021-01-18 DIAGNOSIS — Z1152 Encounter for screening for COVID-19: Secondary | ICD-10-CM | POA: Diagnosis not present

## 2021-01-18 DIAGNOSIS — J019 Acute sinusitis, unspecified: Secondary | ICD-10-CM | POA: Diagnosis not present

## 2021-03-07 DIAGNOSIS — E559 Vitamin D deficiency, unspecified: Secondary | ICD-10-CM | POA: Diagnosis not present

## 2021-03-07 DIAGNOSIS — E7800 Pure hypercholesterolemia, unspecified: Secondary | ICD-10-CM | POA: Diagnosis not present

## 2021-03-21 DIAGNOSIS — R030 Elevated blood-pressure reading, without diagnosis of hypertension: Secondary | ICD-10-CM | POA: Diagnosis not present

## 2021-03-21 DIAGNOSIS — Z0000 Encounter for general adult medical examination without abnormal findings: Secondary | ICD-10-CM | POA: Diagnosis not present

## 2021-03-21 DIAGNOSIS — E7800 Pure hypercholesterolemia, unspecified: Secondary | ICD-10-CM | POA: Diagnosis not present

## 2021-03-21 DIAGNOSIS — R82998 Other abnormal findings in urine: Secondary | ICD-10-CM | POA: Diagnosis not present

## 2021-03-21 DIAGNOSIS — D136 Benign neoplasm of pancreas: Secondary | ICD-10-CM | POA: Diagnosis not present

## 2021-03-21 DIAGNOSIS — Z1212 Encounter for screening for malignant neoplasm of rectum: Secondary | ICD-10-CM | POA: Diagnosis not present

## 2021-03-21 DIAGNOSIS — M8589 Other specified disorders of bone density and structure, multiple sites: Secondary | ICD-10-CM | POA: Diagnosis not present

## 2021-04-12 DIAGNOSIS — L7211 Pilar cyst: Secondary | ICD-10-CM | POA: Diagnosis not present

## 2021-04-12 DIAGNOSIS — D2262 Melanocytic nevi of left upper limb, including shoulder: Secondary | ICD-10-CM | POA: Diagnosis not present

## 2021-04-12 DIAGNOSIS — D1801 Hemangioma of skin and subcutaneous tissue: Secondary | ICD-10-CM | POA: Diagnosis not present

## 2021-04-12 DIAGNOSIS — L821 Other seborrheic keratosis: Secondary | ICD-10-CM | POA: Diagnosis not present

## 2021-04-21 DIAGNOSIS — R102 Pelvic and perineal pain: Secondary | ICD-10-CM | POA: Diagnosis not present

## 2021-04-21 DIAGNOSIS — N361 Urethral diverticulum: Secondary | ICD-10-CM | POA: Diagnosis not present

## 2021-05-03 DIAGNOSIS — R0981 Nasal congestion: Secondary | ICD-10-CM | POA: Diagnosis not present

## 2021-05-03 DIAGNOSIS — J309 Allergic rhinitis, unspecified: Secondary | ICD-10-CM | POA: Diagnosis not present

## 2021-05-03 DIAGNOSIS — R5383 Other fatigue: Secondary | ICD-10-CM | POA: Diagnosis not present

## 2021-05-03 DIAGNOSIS — R12 Heartburn: Secondary | ICD-10-CM | POA: Diagnosis not present

## 2021-06-11 DIAGNOSIS — Z23 Encounter for immunization: Secondary | ICD-10-CM | POA: Diagnosis not present

## 2021-06-21 DIAGNOSIS — H524 Presbyopia: Secondary | ICD-10-CM | POA: Diagnosis not present

## 2021-06-21 DIAGNOSIS — H5213 Myopia, bilateral: Secondary | ICD-10-CM | POA: Diagnosis not present

## 2021-09-23 DIAGNOSIS — R350 Frequency of micturition: Secondary | ICD-10-CM | POA: Diagnosis not present

## 2021-09-23 DIAGNOSIS — M549 Dorsalgia, unspecified: Secondary | ICD-10-CM | POA: Diagnosis not present

## 2021-09-24 DIAGNOSIS — Z20822 Contact with and (suspected) exposure to covid-19: Secondary | ICD-10-CM | POA: Diagnosis not present

## 2021-10-03 IMAGING — MG MM DIGITAL SCREENING BILAT W/ TOMO AND CAD
8 series · 9 of 24 positions shown · non-contrast
Comparison: Previous exam(s).

CLINICAL DATA: Screening.

EXAM:
DIGITAL SCREENING BILATERAL MAMMOGRAM WITH TOMOSYNTHESIS AND CAD
TECHNIQUE: Bilateral screening digital craniocaudal and mediolateral oblique
mammograms were obtained. Bilateral screening digital breast
tomosynthesis was performed. The images were evaluated with
computer-aided detection.

[L CC synth-2D]
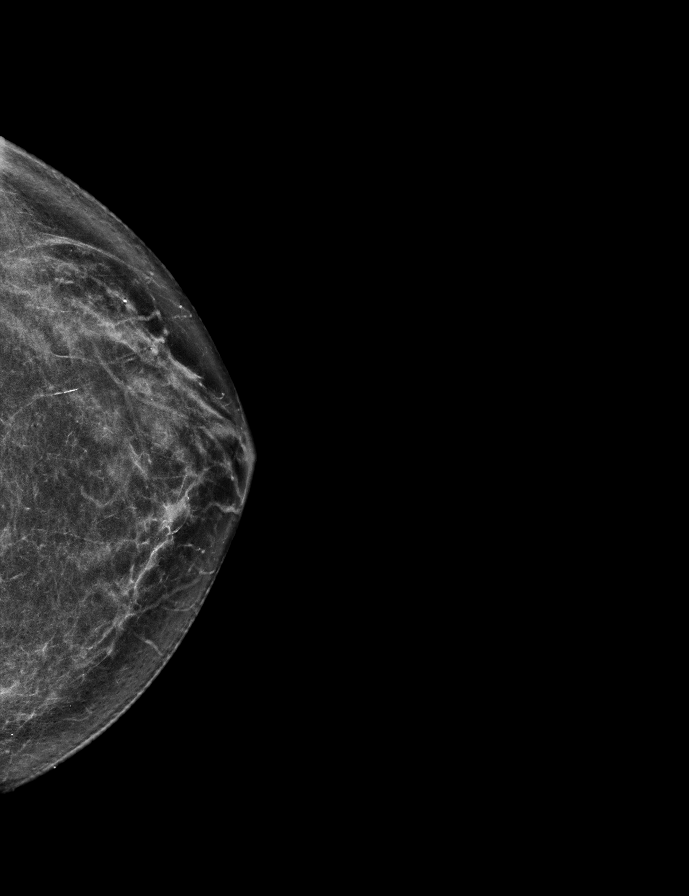

[R MLO synth-2D]
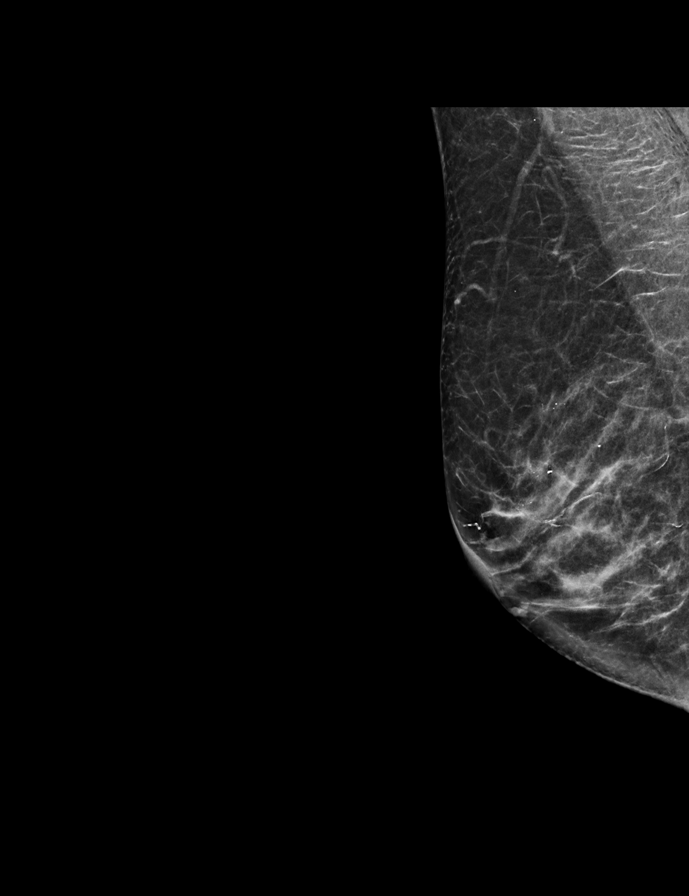

[R CC synth-2D]
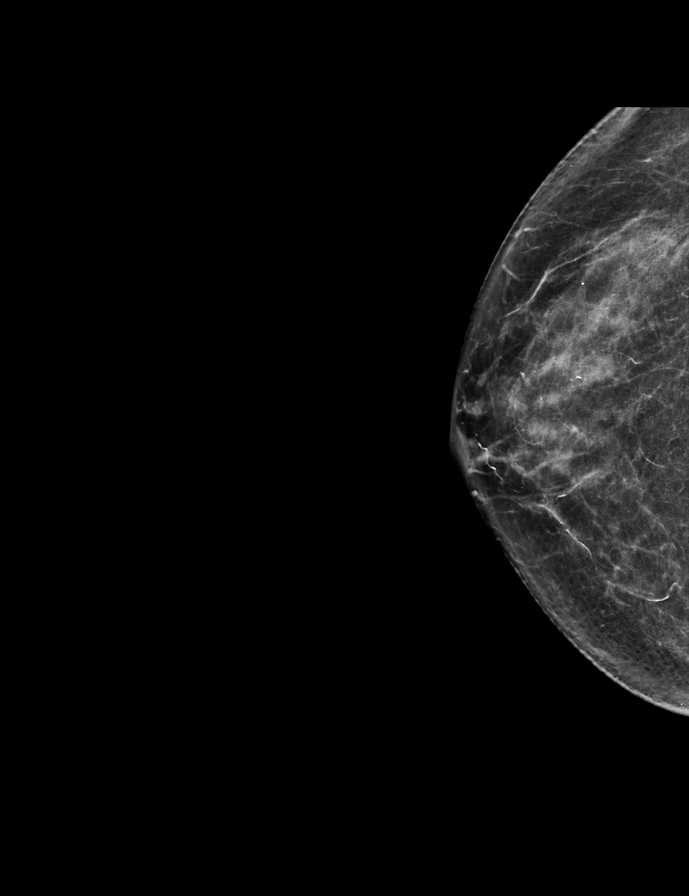

[L MLO synth-2D]
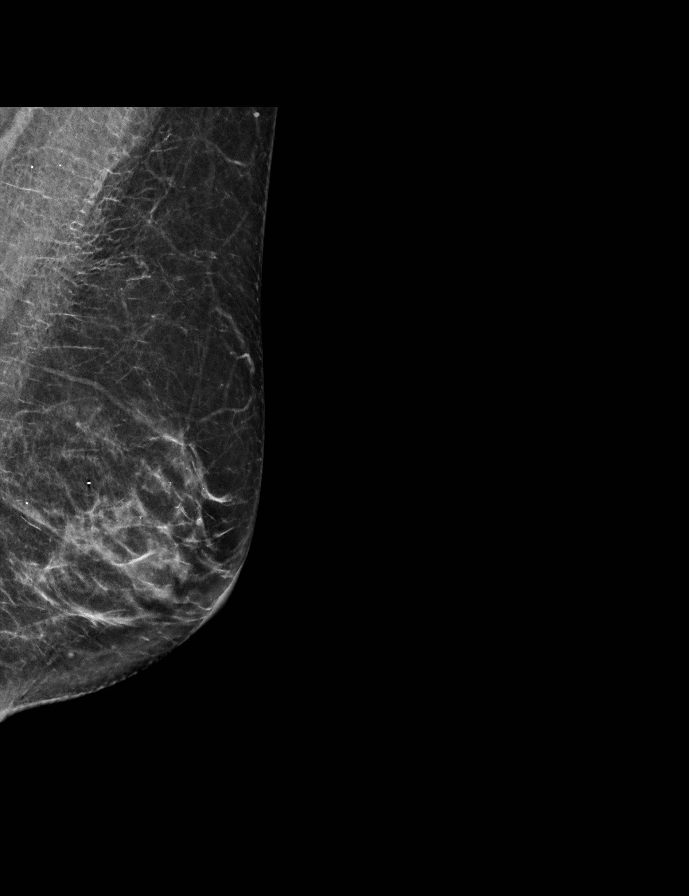

[R CC tomo · 2 of 62 frames shown]
[frame 21/62]
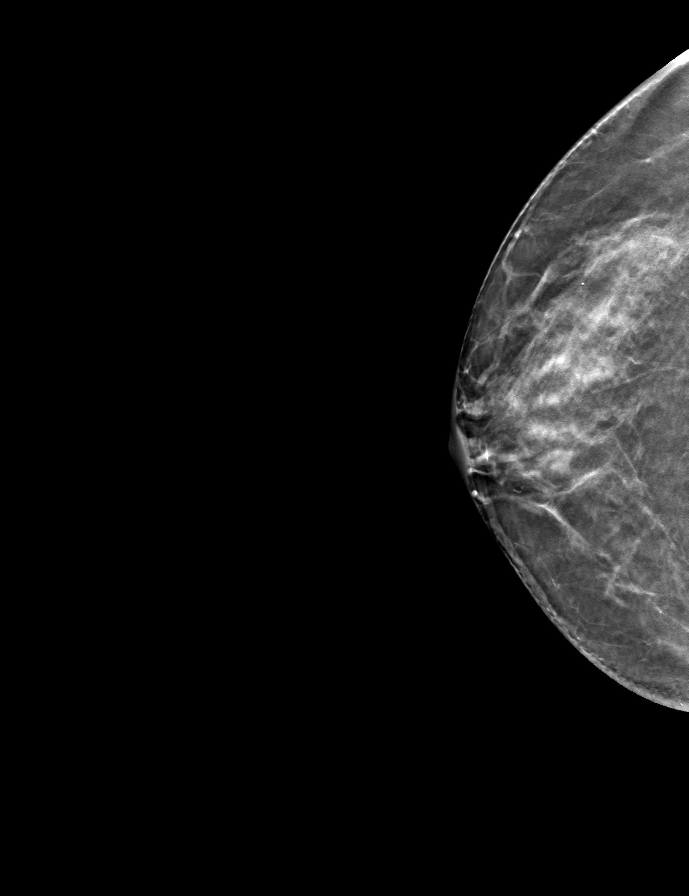
[frame 31/62]
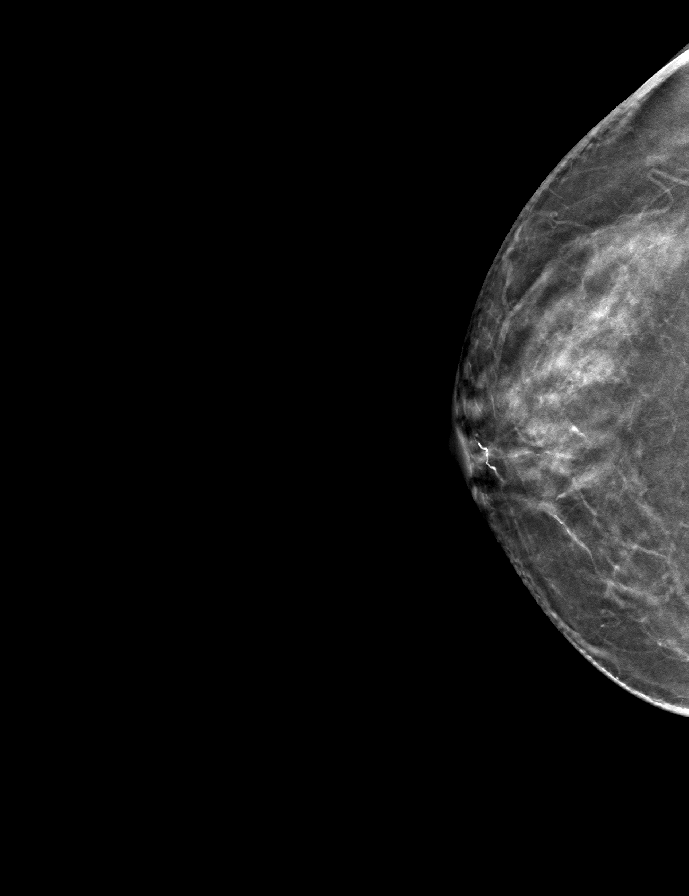

[L MLO tomo · tomo slice 31/62.0]
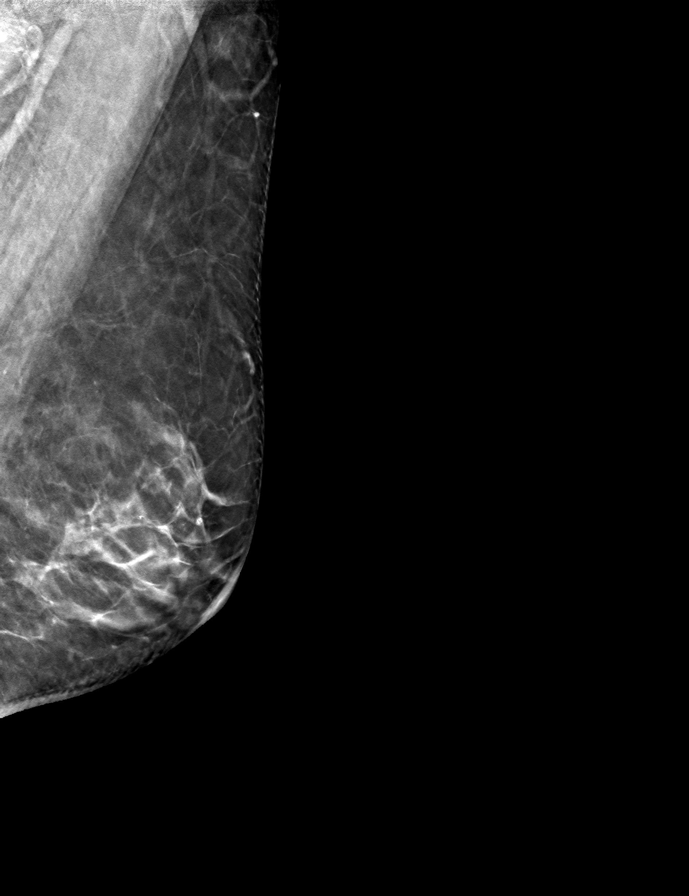

[R MLO tomo · tomo slice 31/62.0]
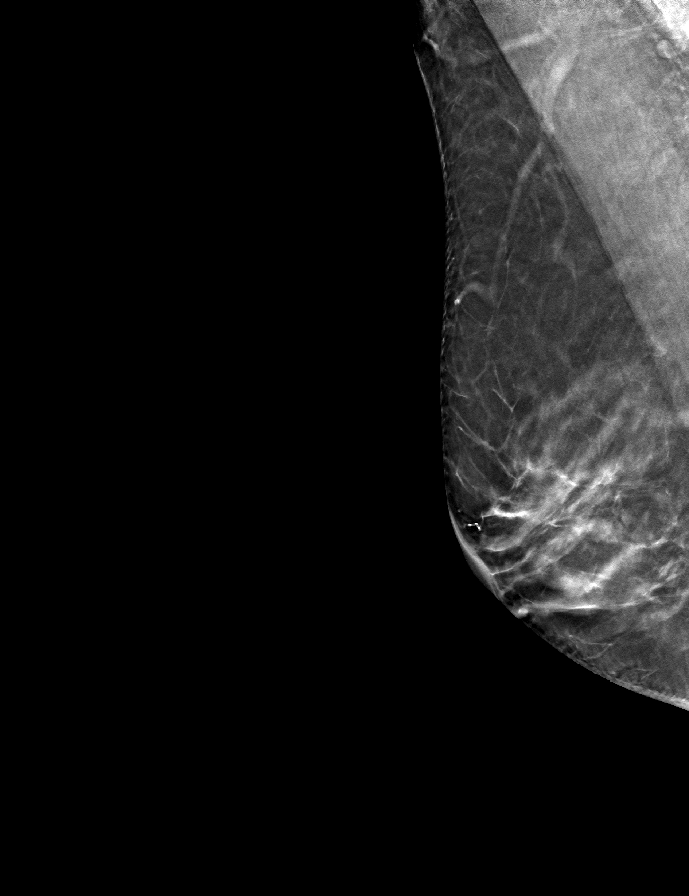

[L CC tomo · tomo slice 33/64.0]
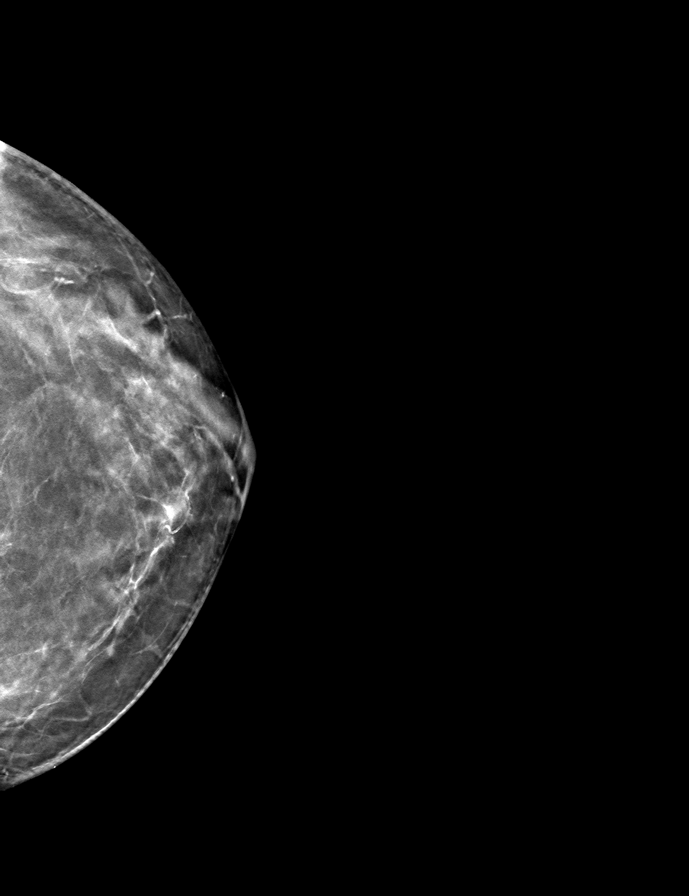

[9 of 24 positions shown; findings below may reference images not displayed]

ACR Breast Density Category c: The breast tissue is heterogeneously
dense, which may obscure small masses.
FINDINGS: There are no findings suspicious for malignancy. The images were
evaluated with computer-aided detection.
IMPRESSION: No mammographic evidence of malignancy. A result letter of this
screening mammogram will be mailed directly to the patient.

RECOMMENDATION:
Screening mammogram in one year. (Code:T4-5-GWO)

BI-RADS CATEGORY  1: Negative.

## 2021-10-21 DIAGNOSIS — D2262 Melanocytic nevi of left upper limb, including shoulder: Secondary | ICD-10-CM | POA: Diagnosis not present

## 2021-10-21 DIAGNOSIS — D2271 Melanocytic nevi of right lower limb, including hip: Secondary | ICD-10-CM | POA: Diagnosis not present

## 2021-10-21 DIAGNOSIS — L821 Other seborrheic keratosis: Secondary | ICD-10-CM | POA: Diagnosis not present

## 2021-10-21 DIAGNOSIS — D2239 Melanocytic nevi of other parts of face: Secondary | ICD-10-CM | POA: Diagnosis not present

## 2021-11-25 DIAGNOSIS — R051 Acute cough: Secondary | ICD-10-CM | POA: Diagnosis not present

## 2021-11-25 DIAGNOSIS — J069 Acute upper respiratory infection, unspecified: Secondary | ICD-10-CM | POA: Diagnosis not present

## 2021-12-15 ENCOUNTER — Other Ambulatory Visit: Payer: Self-pay | Admitting: Obstetrics and Gynecology

## 2021-12-15 DIAGNOSIS — Z1231 Encounter for screening mammogram for malignant neoplasm of breast: Secondary | ICD-10-CM

## 2022-01-18 ENCOUNTER — Ambulatory Visit
Admission: RE | Admit: 2022-01-18 | Discharge: 2022-01-18 | Disposition: A | Discharge status: Home / Self Care | Payer: Medicare Other | Source: Ambulatory Visit | Attending: Obstetrics and Gynecology | Admitting: Obstetrics and Gynecology

## 2022-01-18 DIAGNOSIS — Z1231 Encounter for screening mammogram for malignant neoplasm of breast: Secondary | ICD-10-CM

## 2022-03-06 DIAGNOSIS — M25551 Pain in right hip: Secondary | ICD-10-CM | POA: Diagnosis not present

## 2022-03-06 DIAGNOSIS — M7061 Trochanteric bursitis, right hip: Secondary | ICD-10-CM | POA: Diagnosis not present

## 2022-03-29 DIAGNOSIS — E559 Vitamin D deficiency, unspecified: Secondary | ICD-10-CM | POA: Diagnosis not present

## 2022-03-29 DIAGNOSIS — E785 Hyperlipidemia, unspecified: Secondary | ICD-10-CM | POA: Diagnosis not present

## 2022-03-29 DIAGNOSIS — R7989 Other specified abnormal findings of blood chemistry: Secondary | ICD-10-CM | POA: Diagnosis not present

## 2022-04-05 DIAGNOSIS — R030 Elevated blood-pressure reading, without diagnosis of hypertension: Secondary | ICD-10-CM | POA: Diagnosis not present

## 2022-04-05 DIAGNOSIS — Z0000 Encounter for general adult medical examination without abnormal findings: Secondary | ICD-10-CM | POA: Diagnosis not present

## 2022-04-05 DIAGNOSIS — I868 Varicose veins of other specified sites: Secondary | ICD-10-CM | POA: Diagnosis not present

## 2022-04-05 DIAGNOSIS — E7800 Pure hypercholesterolemia, unspecified: Secondary | ICD-10-CM | POA: Diagnosis not present

## 2022-04-05 DIAGNOSIS — R82998 Other abnormal findings in urine: Secondary | ICD-10-CM | POA: Diagnosis not present

## 2022-04-08 DIAGNOSIS — Z0000 Encounter for general adult medical examination without abnormal findings: Secondary | ICD-10-CM | POA: Diagnosis not present

## 2022-05-19 DIAGNOSIS — H5711 Ocular pain, right eye: Secondary | ICD-10-CM | POA: Diagnosis not present

## 2022-05-19 DIAGNOSIS — D23111 Other benign neoplasm of skin of right upper eyelid, including canthus: Secondary | ICD-10-CM | POA: Diagnosis not present

## 2022-05-24 DIAGNOSIS — L821 Other seborrheic keratosis: Secondary | ICD-10-CM | POA: Diagnosis not present

## 2022-05-24 DIAGNOSIS — D2262 Melanocytic nevi of left upper limb, including shoulder: Secondary | ICD-10-CM | POA: Diagnosis not present

## 2022-05-24 DIAGNOSIS — D2261 Melanocytic nevi of right upper limb, including shoulder: Secondary | ICD-10-CM | POA: Diagnosis not present

## 2022-05-24 DIAGNOSIS — L7211 Pilar cyst: Secondary | ICD-10-CM | POA: Diagnosis not present

## 2022-06-02 DIAGNOSIS — H00025 Hordeolum internum left lower eyelid: Secondary | ICD-10-CM | POA: Diagnosis not present

## 2022-06-27 DIAGNOSIS — H524 Presbyopia: Secondary | ICD-10-CM | POA: Diagnosis not present

## 2022-06-27 DIAGNOSIS — H5213 Myopia, bilateral: Secondary | ICD-10-CM | POA: Diagnosis not present

## 2022-07-26 DIAGNOSIS — Z6823 Body mass index (BMI) 23.0-23.9, adult: Secondary | ICD-10-CM | POA: Diagnosis not present

## 2022-07-26 DIAGNOSIS — Z01419 Encounter for gynecological examination (general) (routine) without abnormal findings: Secondary | ICD-10-CM | POA: Diagnosis not present

## 2022-07-26 DIAGNOSIS — Z124 Encounter for screening for malignant neoplasm of cervix: Secondary | ICD-10-CM | POA: Diagnosis not present

## 2022-07-26 DIAGNOSIS — Z1151 Encounter for screening for human papillomavirus (HPV): Secondary | ICD-10-CM | POA: Diagnosis not present

## 2022-09-29 DIAGNOSIS — M25561 Pain in right knee: Secondary | ICD-10-CM | POA: Diagnosis not present

## 2022-09-29 DIAGNOSIS — M1711 Unilateral primary osteoarthritis, right knee: Secondary | ICD-10-CM | POA: Diagnosis not present

## 2022-10-10 DIAGNOSIS — R030 Elevated blood-pressure reading, without diagnosis of hypertension: Secondary | ICD-10-CM | POA: Diagnosis not present

## 2022-10-10 DIAGNOSIS — K5730 Diverticulosis of large intestine without perforation or abscess without bleeding: Secondary | ICD-10-CM | POA: Diagnosis not present

## 2022-10-10 DIAGNOSIS — D136 Benign neoplasm of pancreas: Secondary | ICD-10-CM | POA: Diagnosis not present

## 2022-10-10 DIAGNOSIS — R1032 Left lower quadrant pain: Secondary | ICD-10-CM | POA: Diagnosis not present

## 2022-10-10 DIAGNOSIS — K5792 Diverticulitis of intestine, part unspecified, without perforation or abscess without bleeding: Secondary | ICD-10-CM | POA: Diagnosis not present

## 2022-12-13 DIAGNOSIS — D2262 Melanocytic nevi of left upper limb, including shoulder: Secondary | ICD-10-CM | POA: Diagnosis not present

## 2022-12-13 DIAGNOSIS — L821 Other seborrheic keratosis: Secondary | ICD-10-CM | POA: Diagnosis not present

## 2022-12-13 DIAGNOSIS — D2272 Melanocytic nevi of left lower limb, including hip: Secondary | ICD-10-CM | POA: Diagnosis not present

## 2022-12-26 ENCOUNTER — Other Ambulatory Visit: Payer: Self-pay | Admitting: Obstetrics and Gynecology

## 2022-12-26 DIAGNOSIS — Z1231 Encounter for screening mammogram for malignant neoplasm of breast: Secondary | ICD-10-CM

## 2023-01-17 DIAGNOSIS — M79662 Pain in left lower leg: Secondary | ICD-10-CM | POA: Diagnosis not present

## 2023-01-17 DIAGNOSIS — M79661 Pain in right lower leg: Secondary | ICD-10-CM | POA: Diagnosis not present

## 2023-01-17 DIAGNOSIS — S86899A Other injury of other muscle(s) and tendon(s) at lower leg level, unspecified leg, initial encounter: Secondary | ICD-10-CM | POA: Diagnosis not present

## 2023-02-07 ENCOUNTER — Ambulatory Visit
Admission: RE | Admit: 2023-02-07 | Discharge: 2023-02-07 | Disposition: A | Discharge status: Home / Self Care | Payer: Medicare Other | Source: Ambulatory Visit | Attending: Obstetrics and Gynecology | Admitting: Obstetrics and Gynecology

## 2023-02-07 DIAGNOSIS — Z1231 Encounter for screening mammogram for malignant neoplasm of breast: Secondary | ICD-10-CM | POA: Diagnosis not present

## 2023-04-05 DIAGNOSIS — R7989 Other specified abnormal findings of blood chemistry: Secondary | ICD-10-CM | POA: Diagnosis not present

## 2023-04-05 DIAGNOSIS — M8589 Other specified disorders of bone density and structure, multiple sites: Secondary | ICD-10-CM | POA: Diagnosis not present

## 2023-04-05 DIAGNOSIS — E785 Hyperlipidemia, unspecified: Secondary | ICD-10-CM | POA: Diagnosis not present

## 2023-04-05 DIAGNOSIS — Z1212 Encounter for screening for malignant neoplasm of rectum: Secondary | ICD-10-CM | POA: Diagnosis not present

## 2023-04-05 DIAGNOSIS — E559 Vitamin D deficiency, unspecified: Secondary | ICD-10-CM | POA: Diagnosis not present

## 2023-04-10 DIAGNOSIS — Z1212 Encounter for screening for malignant neoplasm of rectum: Secondary | ICD-10-CM | POA: Diagnosis not present

## 2023-04-12 DIAGNOSIS — Z1331 Encounter for screening for depression: Secondary | ICD-10-CM | POA: Diagnosis not present

## 2023-04-12 DIAGNOSIS — D039 Melanoma in situ, unspecified: Secondary | ICD-10-CM | POA: Diagnosis not present

## 2023-04-12 DIAGNOSIS — Z1339 Encounter for screening examination for other mental health and behavioral disorders: Secondary | ICD-10-CM | POA: Diagnosis not present

## 2023-04-12 DIAGNOSIS — Z0000 Encounter for general adult medical examination without abnormal findings: Secondary | ICD-10-CM | POA: Diagnosis not present

## 2023-04-12 DIAGNOSIS — R82998 Other abnormal findings in urine: Secondary | ICD-10-CM | POA: Diagnosis not present

## 2023-04-26 DIAGNOSIS — M25511 Pain in right shoulder: Secondary | ICD-10-CM | POA: Diagnosis not present

## 2023-04-26 DIAGNOSIS — M25561 Pain in right knee: Secondary | ICD-10-CM | POA: Diagnosis not present

## 2023-05-01 DIAGNOSIS — M25511 Pain in right shoulder: Secondary | ICD-10-CM | POA: Diagnosis not present

## 2023-05-08 DIAGNOSIS — M25511 Pain in right shoulder: Secondary | ICD-10-CM | POA: Diagnosis not present

## 2023-05-17 DIAGNOSIS — M25511 Pain in right shoulder: Secondary | ICD-10-CM | POA: Diagnosis not present

## 2023-05-22 DIAGNOSIS — M25511 Pain in right shoulder: Secondary | ICD-10-CM | POA: Diagnosis not present

## 2023-05-22 DIAGNOSIS — D0362 Melanoma in situ of left upper limb, including shoulder: Secondary | ICD-10-CM | POA: Diagnosis not present

## 2023-05-30 DIAGNOSIS — M25511 Pain in right shoulder: Secondary | ICD-10-CM | POA: Diagnosis not present

## 2023-05-31 DIAGNOSIS — M25561 Pain in right knee: Secondary | ICD-10-CM | POA: Diagnosis not present

## 2023-05-31 DIAGNOSIS — M25511 Pain in right shoulder: Secondary | ICD-10-CM | POA: Diagnosis not present

## 2023-07-03 DIAGNOSIS — H5213 Myopia, bilateral: Secondary | ICD-10-CM | POA: Diagnosis not present

## 2023-07-04 DIAGNOSIS — M25511 Pain in right shoulder: Secondary | ICD-10-CM | POA: Diagnosis not present

## 2023-07-17 DIAGNOSIS — M25561 Pain in right knee: Secondary | ICD-10-CM | POA: Diagnosis not present

## 2023-07-17 DIAGNOSIS — M25511 Pain in right shoulder: Secondary | ICD-10-CM | POA: Diagnosis not present

## 2023-08-22 DIAGNOSIS — Z01419 Encounter for gynecological examination (general) (routine) without abnormal findings: Secondary | ICD-10-CM | POA: Diagnosis not present

## 2023-08-22 DIAGNOSIS — N3289 Other specified disorders of bladder: Secondary | ICD-10-CM | POA: Diagnosis not present

## 2023-08-22 DIAGNOSIS — Z6823 Body mass index (BMI) 23.0-23.9, adult: Secondary | ICD-10-CM | POA: Diagnosis not present

## 2023-09-14 DIAGNOSIS — R030 Elevated blood-pressure reading, without diagnosis of hypertension: Secondary | ICD-10-CM | POA: Diagnosis not present

## 2023-09-14 DIAGNOSIS — R0981 Nasal congestion: Secondary | ICD-10-CM | POA: Diagnosis not present

## 2023-10-17 DIAGNOSIS — M1711 Unilateral primary osteoarthritis, right knee: Secondary | ICD-10-CM | POA: Diagnosis not present

## 2023-10-25 ENCOUNTER — Ambulatory Visit
Admission: RE | Admit: 2023-10-25 | Discharge: 2023-10-25 | Disposition: A | Discharge status: Home / Self Care | Payer: Medicare Other | Source: Ambulatory Visit | Attending: Registered Nurse | Admitting: Registered Nurse

## 2023-10-25 ENCOUNTER — Other Ambulatory Visit: Payer: Self-pay | Admitting: Registered Nurse

## 2023-10-25 DIAGNOSIS — J029 Acute pharyngitis, unspecified: Secondary | ICD-10-CM | POA: Diagnosis not present

## 2023-10-25 DIAGNOSIS — R590 Localized enlarged lymph nodes: Secondary | ICD-10-CM

## 2023-10-25 DIAGNOSIS — R0981 Nasal congestion: Secondary | ICD-10-CM | POA: Diagnosis not present

## 2023-10-25 DIAGNOSIS — R490 Dysphonia: Secondary | ICD-10-CM

## 2023-10-25 MED ORDER — IOPAMIDOL (ISOVUE-300) INJECTION 61%
200.0000 mL | Freq: Once | INTRAVENOUS | Status: AC | PRN
  Administered 2023-10-25: 75 mL via INTRAVENOUS

## 2023-10-26 DIAGNOSIS — E079 Disorder of thyroid, unspecified: Secondary | ICD-10-CM | POA: Diagnosis not present

## 2023-10-31 DIAGNOSIS — M1711 Unilateral primary osteoarthritis, right knee: Secondary | ICD-10-CM | POA: Diagnosis not present

## 2023-11-07 DIAGNOSIS — M1711 Unilateral primary osteoarthritis, right knee: Secondary | ICD-10-CM | POA: Diagnosis not present

## 2023-11-13 ENCOUNTER — Other Ambulatory Visit: Payer: Self-pay | Admitting: Surgery

## 2023-11-13 DIAGNOSIS — E041 Nontoxic single thyroid nodule: Secondary | ICD-10-CM

## 2023-11-13 DIAGNOSIS — J398 Other specified diseases of upper respiratory tract: Secondary | ICD-10-CM | POA: Diagnosis not present

## 2023-11-14 ENCOUNTER — Ambulatory Visit
Admission: RE | Admit: 2023-11-14 | Discharge: 2023-11-14 | Discharge status: Home / Self Care | Source: Ambulatory Visit | Attending: Surgery | Admitting: Surgery

## 2023-11-14 DIAGNOSIS — E041 Nontoxic single thyroid nodule: Secondary | ICD-10-CM | POA: Diagnosis not present

## 2023-11-14 DIAGNOSIS — M1711 Unilateral primary osteoarthritis, right knee: Secondary | ICD-10-CM | POA: Diagnosis not present

## 2023-11-16 ENCOUNTER — Encounter: Payer: Self-pay | Admitting: Surgery

## 2023-11-16 NOTE — Progress Notes (Signed)
 The right mid 3.4 cm nodule meets criteria on USN for biopsy.  [name] - please arrange for the patient to have an USN guided FNA biopsy of this nodule.  I will contact the patient with the results when they are [place] [id number] [facility] practice Office: [phone]

## 2023-11-20 ENCOUNTER — Other Ambulatory Visit: Payer: Self-pay | Admitting: Surgery

## 2023-11-20 DIAGNOSIS — E041 Nontoxic single thyroid nodule: Secondary | ICD-10-CM

## 2023-11-23 ENCOUNTER — Encounter: Payer: Self-pay | Admitting: Surgery

## 2023-11-27 ENCOUNTER — Ambulatory Visit
Admission: RE | Admit: 2023-11-27 | Discharge: 2023-11-27 | Disposition: A | Discharge status: Home / Self Care | Source: Ambulatory Visit | Attending: Surgery | Admitting: Surgery

## 2023-11-27 ENCOUNTER — Other Ambulatory Visit (HOSPITAL_COMMUNITY)
Admission: RE | Admit: 2023-11-27 | Discharge: 2023-11-27 | Disposition: A | Discharge status: Home / Self Care | Source: Ambulatory Visit | Attending: Interventional Radiology | Admitting: Interventional Radiology

## 2023-11-27 DIAGNOSIS — E041 Nontoxic single thyroid nodule: Secondary | ICD-10-CM

## 2023-11-29 LAB — CYTOLOGY - NON PAP

## 2023-11-30 ENCOUNTER — Encounter: Payer: Self-pay | Admitting: Surgery

## 2023-11-30 DIAGNOSIS — R590 Localized enlarged lymph nodes: Secondary | ICD-10-CM | POA: Diagnosis not present

## 2023-11-30 NOTE — Progress Notes (Signed)
 Good news.  Biopsy is benign.  Unless patient is having symptoms such as dysphagia or discomfort, would plan follow up in one year with repeat USN and TSH level, followed by physical exam in my office.  [name] - please arrange follow up with me.  Darnell Level, MD [facility] practice Office: [phone]

## 2023-12-25 ENCOUNTER — Ambulatory Visit: Payer: Self-pay | Admitting: Surgery

## 2023-12-25 DIAGNOSIS — E041 Nontoxic single thyroid nodule: Secondary | ICD-10-CM | POA: Diagnosis not present

## 2023-12-25 DIAGNOSIS — J398 Other specified diseases of upper respiratory tract: Secondary | ICD-10-CM | POA: Diagnosis not present

## 2024-01-01 NOTE — Progress Notes (Signed)
 COVID Vaccine received:  []  No [x]  Yes Date of any COVID positive Test in last 90 days: no PCP - Malachi Screws MD Cardiologist - n/a  Chest x-ray -  EKG -   Stress Test -  ECHO -  Cardiac Cath -   Bowel Prep - [x]  No  []   Yes ______  Pacemaker / ICD device [x]  No []  Yes   Spinal Cord Stimulator:[x]  No []  Yes       History of Sleep Apnea? [x]  No []  Yes   CPAP used?- [x]  No []  Yes    Does the patient monitor blood sugar?          [x]  No []  Yes  []  N/A  Patient has: [x]  NO Hx DM   []  Pre-DM                 []  DM1  []   DM2 Does patient have a [employer] or Dexacom? []  No []  Yes   Fasting Blood Sugar Ranges-  Checks Blood Sugar _____ times a day  GLP1 agonist / usual dose - no GLP1 instructions:  SGLT-2 inhibitors / usual dose - no SGLT-2 instructions:   Blood Thinner / Instructions:no Aspirin Instructions:no  Comments:   Activity level: Patient is able  to climb a flight of stairs without difficulty; [x]  No CP  []  No SOB,    Patient can  perform ADLs without assistance.   Anesthesia review:   Patient denies shortness of breath, fever, cough and chest pain at PAT appointment.  Patient verbalized understanding and agreement to the Pre-Surgical Instructions that were given to them at this PAT appointment. Patient was also educated of the need to review these PAT instructions again prior to his/her surgery.I reviewed the appropriate phone numbers to call if they have any and questions or concerns.

## 2024-01-01 NOTE — Patient Instructions (Signed)
 SURGICAL WAITING ROOM VISITATION  Patients having surgery or a procedure may have no more than 2 support people in the waiting area - these visitors may rotate.    Children under the age of 4 must have an adult with them who is not the patient.  Due to an increase in RSV and influenza rates and associated hospitalizations, children ages 11 and under may not visit patients in [facility] hospitals.  Visitors with respiratory illnesses are discouraged from visiting and should remain at home.  If the patient needs to stay at the hospital during part of their recovery, the visitor guidelines for inpatient rooms apply. Pre-op nurse will coordinate an appropriate time for 1 support person to accompany patient in pre-op.  This support person may not rotate.    Please refer to the [facility] website for the visitor guidelines for Inpatients (after your surgery is over and you are in a regular room).       Your procedure is scheduled on: 01/11/24   Report to [facility] Main Entrance    Report to admitting at 7:15 AM   Call this number if you have problems the morning of surgery [phone]   Do not eat food :After Midnight.   After Midnight you may have the following liquids until 6:30 AM DAY OF SURGERY  Water Non-Citrus Juices (without pulp, NO RED-Apple, White grape, White cranberry) Black Coffee (NO MILK/CREAM OR CREAMERS, sugar ok)  Clear Tea (NO MILK/CREAM OR CREAMERS, sugar ok) regular and decaf                             Plain Jell-O (NO RED)                                           Fruit ices (not with fruit pulp, NO RED)                                     Popsicles (NO RED)                                                               Sports drinks like Gatorade (NO RED)                    Oral Hygiene is also important to reduce your risk of infection.                                    Remember - BRUSH YOUR TEETH THE MORNING OF SURGERY WITH YOUR REGULAR  TOOTHPASTE  DENTURES WILL BE REMOVED PRIOR TO SURGERY PLEASE DO NOT APPLY "Poly grip" OR ADHESIVES!!!   Stop all vitamins and herbal supplements 7 days before surgery.   Take these medicines the morning of surgery with A SIP OF WATER: Atorvastatin, fexofenadine(allegra),Pantoprazole(Protonix),Tylenol, nasal spray  You may not have any metal on your body including hair pins, jewelry, and body piercing             Do not wear make-up, lotions, powders, perfumes/cologne, or deodorant  Do not wear nail polish including gel and S&S, artificial/acrylic nails, or any other type of covering on natural nails including finger and toenails. If you have artificial nails, gel coating, etc. that needs to be removed by a nail salon please have this removed prior to surgery or surgery may need to be canceled/ delayed if the surgeon/ anesthesia feels like they are unable to be safely monitored.   Do not shave  48 hours prior to surgery.    Do not bring valuables to the hospital. [facility] IS NOT             RESPONSIBLE   FOR VALUABLES.   Contacts, glasses, dentures or bridgework may not be worn into surgery.   Bring small overnight bag day of surgery.   DO NOT BRING YOUR HOME MEDICATIONS TO THE HOSPITAL. PHARMACY WILL DISPENSE MEDICATIONS LISTED ON YOUR MEDICATION LIST TO YOU DURING YOUR ADMISSION IN THE HOSPITAL!    Patients discharged on the day of surgery will not be allowed to drive home.  Someone NEEDS to stay with you for the first 24 hours after anesthesia.   Special Instructions: Bring a copy of your healthcare power of attorney and living will documents the day of surgery if you haven't scanned them before.              Please read over the following fact sheets you were given: IF YOU HAVE QUESTIONS ABOUT YOUR PRE-OP INSTRUCTIONS PLEASE CALL [phone] Ammon Bales   If you received a COVID test during your pre-op visit  it is requested that you wear a mask when  out in public, stay away from anyone that may not be feeling well and notify your surgeon if you develop symptoms. If you test positive for Covid or have been in contact with anyone that has tested positive in the last 10 days please notify you surgeon.    [facility] - Preparing for Surgery Before surgery, you can play an important role.  Because skin is not sterile, your skin needs to be as free of germs as possible.  You can reduce the number of germs on your skin by washing with CHG (chlorahexidine gluconate) soap before surgery.  CHG is an antiseptic cleaner which kills germs and bonds with the skin to continue killing germs even after washing. Please DO NOT use if you have an allergy to CHG or antibacterial soaps.  If your skin becomes reddened/irritated stop using the CHG and inform your nurse when you arrive at Short Stay. Do not shave (including legs and underarms) for at least 48 hours prior to the first CHG shower.  You may shave your face/neck.  Please follow these instructions carefully:  1.  Shower with CHG Soap the night before surgery and the  morning of surgery.  2.  If you choose to wash your hair, wash your hair first as usual with your normal  shampoo.  3.  After you shampoo, rinse your hair and body thoroughly to remove the shampoo.                             4.  Use CHG as you would any other liquid soap.  You can apply chg directly  to the skin and wash.  Gently with a scrungie or clean washcloth.  5.  Apply the CHG Soap to your body ONLY FROM THE NECK DOWN.   Do   not use on face/ open                           Wound or open sores. Avoid contact with eyes, ears mouth and   genitals (private parts).                       Wash face,  Genitals (private parts) with your normal soap.             6.  Wash thoroughly, paying special attention to the area where your    surgery  will be performed.  7.  Thoroughly rinse your body with warm water from the neck down.  8.  DO NOT  shower/wash with your normal soap after using and rinsing off the CHG Soap.                9.  Pat yourself dry with a clean towel.            10.  Wear clean pajamas.            11.  Place clean sheets on your bed the night of your first shower and do not  sleep with pets. Day of Surgery : Do not apply any lotions/deodorants the morning of surgery.  Please wear clean clothes to the hospital/surgery center.  FAILURE TO FOLLOW THESE INSTRUCTIONS MAY RESULT IN THE CANCELLATION OF YOUR SURGERY  PATIENT SIGNATURE_________________________________  NURSE SIGNATURE__________________________________  ________________________________________________________________________

## 2024-01-03 ENCOUNTER — Encounter (HOSPITAL_COMMUNITY)
Admission: RE | Admit: 2024-01-03 | Discharge: 2024-01-03 | Disposition: A | Discharge status: Home / Self Care | Source: Ambulatory Visit | Attending: Surgery | Admitting: Surgery

## 2024-01-03 ENCOUNTER — Encounter (HOSPITAL_COMMUNITY): Payer: Self-pay

## 2024-01-03 ENCOUNTER — Other Ambulatory Visit: Payer: Self-pay

## 2024-01-03 VITALS — BP 164/74 | HR 57 | Temp 98.1°F | Resp 16 | Ht 60.0 in | Wt 116.0 lb

## 2024-01-03 DIAGNOSIS — Z01818 Encounter for other preprocedural examination: Secondary | ICD-10-CM | POA: Insufficient documentation

## 2024-01-03 HISTORY — DX: Gastro-esophageal reflux disease without esophagitis: K21.9

## 2024-01-03 HISTORY — DX: Dyspnea, unspecified: R06.00

## 2024-01-03 LAB — CBC
HCT: 44.6 % (ref 36.0–46.0)
Hemoglobin: 14.3 g/dL (ref 12.0–15.0)
MCH: 29.8 pg (ref 26.0–34.0)
MCHC: 32.1 g/dL (ref 30.0–36.0)
MCV: 92.9 fL (ref 80.0–100.0)
Platelets: 224 10*3/uL (ref 150–400)
RBC: 4.8 MIL/uL (ref 3.87–5.11)
RDW: 12.6 % (ref 11.5–15.5)
WBC: 5.8 10*3/uL (ref 4.0–10.5)
nRBC: 0 % (ref 0.0–0.2)

## 2024-01-06 ENCOUNTER — Encounter (HOSPITAL_COMMUNITY): Payer: Self-pay | Admitting: Surgery

## 2024-01-06 DIAGNOSIS — J398 Other specified diseases of upper respiratory tract: Secondary | ICD-10-CM | POA: Diagnosis present

## 2024-01-06 DIAGNOSIS — E041 Nontoxic single thyroid nodule: Secondary | ICD-10-CM | POA: Diagnosis present

## 2024-01-06 NOTE — H&P (Signed)
 PROVIDER: Deazia Lampi Arcola Kocher, MD   Chief Complaint: Follow-up (Discuss Thyroid  Lobectomy)  History of Present Illness:  Patient returns for follow-up accompanied by her husband to discuss results and to discuss further surgical management. At my request the patient underwent an ultrasound study. This demonstrated a dominant 3.4 cm nodule in the mid right thyroid  lobe as well as a smaller 1.6 cm nodule in the inferior right thyroid  lobe. Fine-needle aspiration of the dominant nodule was benign. However, the patient has continued to have mild compressive symptoms including globus sensation and some dyspnea when recumbent. She presents today to discuss proceeding with thyroid  surgery.   Review of Systems: A complete review of systems was obtained from the patient. I have reviewed this information and discussed as appropriate with the patient. See HPI as well for other ROS.  ROS   Medical History: Past Medical History:  Diagnosis Date  Arthritis  GERD (gastroesophageal reflux disease)   Patient Active Problem List  Diagnosis  Right thyroid  nodule  Tracheal deviation   Past Surgical History:  Procedure Laterality Date  BREAST EXCISIONAL BIOPSY  TONSILLECTOMY    No Known Allergies  Current Outpatient Medications on File Prior to Visit  Medication Sig Dispense Refill  atorvastatin (LIPITOR) 20 MG tablet Take 20 mg by mouth once daily  calcium carbonate-vit D3-min 600 mg-10 mcg (400 unit) Tab Take by mouth  cholecalciferol (VITAMIN D3) 1000 unit capsule Take 1 capsule by mouth once daily  fluticasone propionate (FLONASE) 50 mcg/actuation nasal spray Place 2 sprays into both nostrils once daily  ibandronate (BONIVA) 150 mg tablet TAKE 1 TABLET ONCE A MONTH 60 MIN BEFORE THE FIRST FOOD, BEVERAGE OR MEDICINE OF THE DAY WITH PLAIN WATER  magnesium 250 mg Tab Take 1 tablet by mouth every morning before breakfast (0630)  pantoprazole (PROTONIX) 20 MG DR tablet Take 20 mg by mouth  once daily  vitamin E 400 UNIT capsule Take 400 Units by mouth once daily   No current facility-administered medications on file prior to visit.   Family History  Problem Relation Age of Onset  Other (mouth and neck cancer) Mother    Social History   Tobacco Use  Smoking Status Never  Smokeless Tobacco Never    Social History   Socioeconomic History  Marital status: Married  Tobacco Use  Smoking status: Never  Smokeless tobacco: Never  Vaping Use  Vaping status: Never Used  Substance and Sexual Activity  Alcohol use: Yes  Drug use: Never   Social Drivers of Health   Housing Stability: Unknown (11/13/2023)  Housing Stability Vital Sign  Homeless in the Last Year: No   Objective:   Vitals:  PainSc: 0-No pain  PainLoc: Neck   There is no height or weight on file to calculate BMI.  Physical Exam   GENERAL APPEARANCE Comfortable, no acute issues Development: normal Gross deformities: none  SKIN Rash, lesions, ulcers: none Induration, erythema: none Nodules: none palpable  EYES Conjunctiva and lids: normal Pupils: equal  EARS, NOSE, MOUTH, THROAT External ears: no lesion or deformity External nose: no lesion or deformity Hearing: grossly normal  NECK Symmetric: no Trachea: Mild deviation to the left Thyroid : Dominant nodule in the right thyroid  lobe is somewhat difficult to palpate, relatively soft, nontender. Left thyroid  lobe is without palpable abnormality.  CHEST/CV Not assessed  ABDOMEN Not assessed  GENITOURINARY/RECTAL Not assessed  MUSCULOSKELETAL Station and gait: normal Digits and nails: no clubbing or cyanosis Muscle strength: grossly normal all extremities Deformity:  none  LYMPHATIC Cervical: none palpable Supraclavicular: none palpable  PSYCHIATRIC Oriented to person, place, and time: yes Mood and affect: normal for situation Judgment and insight: appropriate for situation   Assessment and Plan:   Tracheal  deviation Right thyroid  nodule  Patient returns today accompanied by her husband to review the results of her ultrasound and fine-needle aspiration biopsy. We also spent a lengthy time discussing surgical management of the dominant nodule in the right thyroid  lobe. I have recommended proceeding with right thyroid  lobectomy. We have discussed the risks and benefits of that procedure including the risk of recurrent laryngeal nerve injury and injury to parathyroid glands. We have discussed the pros and cons of this approach. Hopefully with a normal left thyroid  lobe the patient might not require thyroid  hormone supplementation following surgery. We also discussed the possibility of the need for further surgery in the event of malignancy.  After careful consideration, the patient would like to proceed with right thyroid  lobectomy. We will make arrangements for this procedure at a time convenient for the patient in the near future. We discussed the hospital stay to be anticipated. We discussed the size and location of the surgical incision. We discussed the postoperative recovery. The patient understands and wishes to proceed.  Oralee Billow, MD [facility] practice Office: [phone]

## 2024-01-11 ENCOUNTER — Encounter (HOSPITAL_COMMUNITY): Payer: Self-pay | Admitting: Surgery

## 2024-01-11 ENCOUNTER — Other Ambulatory Visit: Payer: Self-pay

## 2024-01-11 ENCOUNTER — Encounter (HOSPITAL_COMMUNITY)
Admission: RE | Disposition: A | Discharge status: Home / Self Care | Payer: Self-pay | Source: Home / Self Care | Attending: Surgery

## 2024-01-11 ENCOUNTER — Ambulatory Visit (HOSPITAL_COMMUNITY)

## 2024-01-11 ENCOUNTER — Ambulatory Visit (HOSPITAL_BASED_OUTPATIENT_CLINIC_OR_DEPARTMENT_OTHER)

## 2024-01-11 ENCOUNTER — Ambulatory Visit (HOSPITAL_COMMUNITY)
Admission: RE | Admit: 2024-01-11 | Discharge: 2024-01-12 | Disposition: A | Discharge status: Home / Self Care | Attending: Surgery | Admitting: Surgery

## 2024-01-11 DIAGNOSIS — M1990 Unspecified osteoarthritis, unspecified site: Secondary | ICD-10-CM | POA: Diagnosis not present

## 2024-01-11 DIAGNOSIS — E041 Nontoxic single thyroid nodule: Secondary | ICD-10-CM | POA: Diagnosis not present

## 2024-01-11 DIAGNOSIS — J398 Other specified diseases of upper respiratory tract: Secondary | ICD-10-CM | POA: Diagnosis not present

## 2024-01-11 DIAGNOSIS — E042 Nontoxic multinodular goiter: Secondary | ICD-10-CM | POA: Diagnosis not present

## 2024-01-11 DIAGNOSIS — K219 Gastro-esophageal reflux disease without esophagitis: Secondary | ICD-10-CM | POA: Insufficient documentation

## 2024-01-11 DIAGNOSIS — D34 Benign neoplasm of thyroid gland: Secondary | ICD-10-CM | POA: Insufficient documentation

## 2024-01-11 HISTORY — PX: THYROID LOBECTOMY: SHX420

## 2024-01-11 SURGERY — LOBECTOMY, THYROID
Anesthesia: General | Laterality: Right

## 2024-01-11 MED ORDER — OXYCODONE HCL 5 MG/5ML PO SOLN: 5.0000 mg | Freq: Once | ORAL | Status: DC | PRN

## 2024-01-11 MED ORDER — HYDROMORPHONE HCL 1 MG/ML IJ SOLN: 1.0000 mg | INTRAMUSCULAR | Status: DC | PRN

## 2024-01-11 MED ORDER — ROCURONIUM BROMIDE 100 MG/10ML IV SOLN
INTRAVENOUS | Status: DC | PRN
  Administered 2024-01-11: 10 mg via INTRAVENOUS
  Administered 2024-01-11: 50 mg via INTRAVENOUS

## 2024-01-11 MED ORDER — SODIUM CHLORIDE 0.45 % IV SOLN: INTRAVENOUS | Status: DC

## 2024-01-11 MED ORDER — EPHEDRINE SULFATE (PRESSORS) 50 MG/ML IJ SOLN
INTRAMUSCULAR | Status: DC | PRN
  Administered 2024-01-11 (×2): 5 mg via INTRAVENOUS

## 2024-01-11 MED ORDER — ONDANSETRON 4 MG PO TBDP: 4.0000 mg | ORAL_TABLET | Freq: Four times a day (QID) | ORAL | Status: DC | PRN

## 2024-01-11 MED ORDER — FENTANYL CITRATE (PF) 100 MCG/2ML IJ SOLN
INTRAMUSCULAR | Status: AC
  Filled 2024-01-11: qty 2

## 2024-01-11 MED ORDER — OXYCODONE HCL 5 MG PO TABS: 5.0000 mg | ORAL_TABLET | Freq: Once | ORAL | Status: DC | PRN

## 2024-01-11 MED ORDER — TRAMADOL HCL 50 MG PO TABS: 50.0000 mg | ORAL_TABLET | Freq: Four times a day (QID) | ORAL | Status: DC | PRN

## 2024-01-11 MED ORDER — HYDRALAZINE HCL 20 MG/ML IJ SOLN
10.0000 mg | Freq: Once | INTRAMUSCULAR | Status: AC
  Administered 2024-01-11: 10 mg via INTRAVENOUS

## 2024-01-11 MED ORDER — FENTANYL CITRATE (PF) 100 MCG/2ML IJ SOLN
INTRAMUSCULAR | Status: DC | PRN
  Administered 2024-01-11 (×2): 25 ug via INTRAVENOUS
  Administered 2024-01-11: 50 ug via INTRAVENOUS

## 2024-01-11 MED ORDER — ACETAMINOPHEN 650 MG RE SUPP: 650.0000 mg | Freq: Four times a day (QID) | RECTAL | Status: DC | PRN

## 2024-01-11 MED ORDER — CHLORHEXIDINE GLUCONATE CLOTH 2 % EX PADS
6.0000 | MEDICATED_PAD | Freq: Once | CUTANEOUS | Status: DC
Start: 2024-01-11 — End: 2024-01-11
  Administered 2024-01-11: 6 via TOPICAL

## 2024-01-11 MED ORDER — CHLORHEXIDINE GLUCONATE CLOTH 2 % EX PADS
6.0000 | MEDICATED_PAD | Freq: Once | CUTANEOUS | Status: DC
  Administered 2024-01-11: 6 via TOPICAL

## 2024-01-11 MED ORDER — SODIUM CHLORIDE 0.9 % IV SOLN: 12.5000 mg | INTRAVENOUS | Status: DC | PRN

## 2024-01-11 MED ORDER — HYDRALAZINE HCL 20 MG/ML IJ SOLN
INTRAMUSCULAR | Status: AC
  Filled 2024-01-11: qty 1

## 2024-01-11 MED ORDER — OXYCODONE HCL 5 MG PO TABS: 5.0000 mg | ORAL_TABLET | ORAL | Status: DC | PRN

## 2024-01-11 MED ORDER — ACETAMINOPHEN 325 MG PO TABS: 650.0000 mg | ORAL_TABLET | Freq: Four times a day (QID) | ORAL | Status: DC | PRN

## 2024-01-11 MED ORDER — ONDANSETRON HCL 4 MG/2ML IJ SOLN
4.0000 mg | Freq: Four times a day (QID) | INTRAMUSCULAR | Status: DC | PRN
  Administered 2024-01-11: 4 mg via INTRAVENOUS
  Filled 2024-01-11: qty 2

## 2024-01-11 MED ORDER — PROCHLORPERAZINE EDISYLATE 10 MG/2ML IJ SOLN
10.0000 mg | Freq: Four times a day (QID) | INTRAMUSCULAR | Status: DC | PRN
  Administered 2024-01-11: 10 mg via INTRAVENOUS

## 2024-01-11 MED ORDER — PANTOPRAZOLE SODIUM 20 MG PO TBEC
20.0000 mg | DELAYED_RELEASE_TABLET | Freq: Every day | ORAL | Status: DC
  Filled 2024-01-11: qty 1

## 2024-01-11 MED ORDER — ORAL CARE MOUTH RINSE: 15.0000 mL | Freq: Once | OROMUCOSAL | Status: AC

## 2024-01-11 MED ORDER — DEXAMETHASONE SODIUM PHOSPHATE 10 MG/ML IJ SOLN
INTRAMUSCULAR | Status: DC | PRN
  Administered 2024-01-11: 8 mg via INTRAVENOUS

## 2024-01-11 MED ORDER — HYDROMORPHONE HCL 1 MG/ML IJ SOLN
0.2500 mg | INTRAMUSCULAR | Status: DC | PRN
  Administered 2024-01-11 (×2): 0.5 mg via INTRAVENOUS

## 2024-01-11 MED ORDER — HEMOSTATIC AGENTS (NO CHARGE) OPTIME
TOPICAL | Status: DC | PRN
  Administered 2024-01-11: 1 via TOPICAL

## 2024-01-11 MED ORDER — ONDANSETRON HCL 4 MG/2ML IJ SOLN: 4.0000 mg | Freq: Four times a day (QID) | INTRAMUSCULAR | Status: DC | PRN

## 2024-01-11 MED ORDER — LIDOCAINE HCL (CARDIAC) PF 100 MG/5ML IV SOSY
PREFILLED_SYRINGE | INTRAVENOUS | Status: DC | PRN
  Administered 2024-01-11: 60 mg via INTRAVENOUS

## 2024-01-11 MED ORDER — TRAMADOL HCL 50 MG PO TABS: 50.0000 mg | ORAL_TABLET | Freq: Four times a day (QID) | ORAL | 0 refills | Status: DC | PRN

## 2024-01-11 MED ORDER — PROPOFOL 10 MG/ML IV BOLUS
INTRAVENOUS | Status: DC | PRN
  Administered 2024-01-11: 150 mg via INTRAVENOUS

## 2024-01-11 MED ORDER — HYDROMORPHONE HCL 1 MG/ML IJ SOLN
INTRAMUSCULAR | Status: AC
  Filled 2024-01-11: qty 1

## 2024-01-11 MED ORDER — LACTATED RINGERS IV SOLN: INTRAVENOUS | Status: DC | PRN

## 2024-01-11 MED ORDER — CEFAZOLIN SODIUM-DEXTROSE 2-4 GM/100ML-% IV SOLN
2.0000 g | INTRAVENOUS | Status: AC
  Administered 2024-01-11: 2 g via INTRAVENOUS
  Filled 2024-01-11: qty 100

## 2024-01-11 MED ORDER — 0.9 % SODIUM CHLORIDE (POUR BTL) OPTIME
TOPICAL | Status: DC | PRN
  Administered 2024-01-11: 1000 mL

## 2024-01-11 MED ORDER — ONDANSETRON HCL 4 MG/2ML IJ SOLN
INTRAMUSCULAR | Status: DC | PRN
  Administered 2024-01-11: 4 mg via INTRAVENOUS

## 2024-01-11 MED ORDER — LACTATED RINGERS IV SOLN: INTRAVENOUS | Status: DC

## 2024-01-11 MED ORDER — CHLORHEXIDINE GLUCONATE 0.12 % MT SOLN
15.0000 mL | Freq: Once | OROMUCOSAL | Status: AC
  Administered 2024-01-11: 15 mL via OROMUCOSAL

## 2024-01-11 MED ORDER — DEXMEDETOMIDINE HCL IN NACL 80 MCG/20ML IV SOLN
INTRAVENOUS | Status: DC | PRN
  Administered 2024-01-11 (×2): 4 ug via INTRAVENOUS

## 2024-01-11 MED ORDER — SUGAMMADEX SODIUM 200 MG/2ML IV SOLN
INTRAVENOUS | Status: DC | PRN
  Administered 2024-01-11: 50 mg via INTRAVENOUS
  Administered 2024-01-11: 100 mg via INTRAVENOUS

## 2024-01-11 MED ORDER — OXYCODONE HCL 5 MG PO TABS
5.0000 mg | ORAL_TABLET | ORAL | Status: DC | PRN
Start: 2024-01-11 — End: 2024-01-11

## 2024-01-11 MED ORDER — PROCHLORPERAZINE EDISYLATE 10 MG/2ML IJ SOLN
INTRAMUSCULAR | Status: AC
  Filled 2024-01-11: qty 2

## 2024-01-11 MED ORDER — AMISULPRIDE (ANTIEMETIC) 5 MG/2ML IV SOLN: 10.0000 mg | Freq: Once | INTRAVENOUS | Status: DC | PRN

## 2024-01-11 SURGICAL SUPPLY — 27 items
BAG COUNTER SPONGE SURGICOUNT (BAG) ×2 IMPLANT
BLADE SURG 15 STRL LF DISP TIS (BLADE) ×2 IMPLANT
CHLORAPREP W/TINT 26 (MISCELLANEOUS) ×2 IMPLANT
CLIP TI MEDIUM 6 (CLIP) ×4 IMPLANT
CLIP TI WIDE RED SMALL 6 (CLIP) ×4 IMPLANT
COVER SURGICAL LIGHT HANDLE (MISCELLANEOUS) ×2 IMPLANT
DERMABOND ADVANCED .7 DNX12 (GAUZE/BANDAGES/DRESSINGS) ×2 IMPLANT
DRAPE LAPAROTOMY T 98X78 PEDS (DRAPES) ×2 IMPLANT
DRAPE UTILITY XL STRL (DRAPES) ×2 IMPLANT
ELECT PENCIL ROCKER SW 15FT (MISCELLANEOUS) ×2 IMPLANT
ELECT REM PT RETURN 15FT ADLT (MISCELLANEOUS) ×2 IMPLANT
GAUZE 4X4 16PLY ~~LOC~~+RFID DBL (SPONGE) ×2 IMPLANT
GLOVE SURG ORTHO 8.0 STRL STRW (GLOVE) ×2 IMPLANT
GOWN STRL REUS W/ TWL XL LVL3 (GOWN DISPOSABLE) ×4 IMPLANT
HEMOSTAT SURGICEL 2X4 FIBR (HEMOSTASIS) ×2 IMPLANT
ILLUMINATOR WAVEGUIDE N/F (MISCELLANEOUS) ×2 IMPLANT
KIT BASIN OR (CUSTOM PROCEDURE TRAY) ×2 IMPLANT
KIT TURNOVER KIT A (KITS) IMPLANT
PACK BASIC VI WITH GOWN DISP (CUSTOM PROCEDURE TRAY) ×2 IMPLANT
PAD MAGNETIC INSTR ST 16X20 (MISCELLANEOUS) ×2 IMPLANT
SHEARS HARMONIC 9CM CVD (BLADE) ×2 IMPLANT
SUT MNCRL AB 4-0 PS2 18 (SUTURE) ×2 IMPLANT
SUT SILK 3 0 SH 30 (SUTURE) ×2 IMPLANT
SUT VIC AB 3-0 SH 18 (SUTURE) ×4 IMPLANT
SYR BULB IRRIG 60ML STRL (SYRINGE) ×2 IMPLANT
TOWEL OR 17X26 10 PK STRL BLUE (TOWEL DISPOSABLE) ×2 IMPLANT
TUBING CONNECTING 10 (TUBING) ×2 IMPLANT

## 2024-01-11 NOTE — Anesthesia Postprocedure Evaluation (Signed)
 Anesthesia Post Note  Patient: Jessica Osborn  Procedure(s) Performed: RIGHT LOBECTOMY, THYROID  (Right)     Patient location during evaluation: PACU Anesthesia Type: General Level of consciousness: awake and alert Pain management: pain level controlled Vital Signs Assessment: post-procedure vital signs reviewed and stable Respiratory status: spontaneous breathing, nonlabored ventilation and respiratory function stable Cardiovascular status: blood pressure returned to baseline and stable Postop Assessment: no apparent nausea or vomiting Anesthetic complications: no   No notable events documented.  Last Vitals:  Vitals:   01/11/24 1215 01/11/24 1237  BP: (!) 151/63 (!) 154/61  Pulse: 77 83  Resp: 14 16  Temp: (!) 36.4 C   SpO2: 98% 100%    Last Pain:  Vitals:   01/11/24 1237  TempSrc:   PainSc: 0-No pain                 Earvin Goldberg

## 2024-01-11 NOTE — Transfer of Care (Signed)
 Immediate Anesthesia Transfer of Care Note  Patient: Jessica Osborn  Procedure(s) Performed: RIGHT LOBECTOMY, THYROID  (Right)  Patient Location: PACU  Anesthesia Type:General  Level of Consciousness: awake, patient cooperative, and responds to stimulation  Airway & Oxygen Therapy: Patient Spontanous Breathing and Patient connected to face mask oxygen  Post-op Assessment: Report given to RN and Post -op Vital signs reviewed and stable  Post vital signs: Reviewed and stable  Last Vitals:  Vitals Value Taken Time  BP 188/87 01/11/24 1105  Temp    Pulse 79 01/11/24 1105  Resp 11 01/11/24 1105  SpO2 100 01/11/24 1105    Last Pain:  Vitals:   01/11/24 0807  TempSrc:   PainSc: 0-No pain         Complications: No notable events documented.

## 2024-01-11 NOTE — Op Note (Signed)
 Procedure Note  Pre-operative Diagnosis:  right thyroid  nodules  Post-operative Diagnosis:  same  Surgeon:  Oralee Billow, MD  Assistant:  none   Procedure:  Right thyroid  lobectomy and isthmusectomy  Anesthesia:  General  Estimated Blood Loss:  minimal  Drains: none         Specimen: thyroid  lobe to pathology  Indications:  Patient returns for follow-up accompanied by her husband to discuss results and to discuss further surgical management. At my request the patient underwent an ultrasound study. This demonstrated a dominant 3.4 cm nodule in the mid right thyroid  lobe as well as a smaller 1.6 cm nodule in the inferior right thyroid  lobe. Fine-needle aspiration of the dominant nodule was benign. However, the patient has continued to have mild compressive symptoms including globus sensation and some dyspnea when recumbent. She presents today to discuss proceeding with thyroid  surgery.   Procedure Details: Procedure was done in OR #2 at the [facility]. The patient was brought to the operating room and placed in a supine position on the operating room table. Following administration of general anesthesia, the patient was positioned and then prepped and draped in the usual aseptic fashion. After ascertaining that an adequate level of anesthesia had been achieved, a small Kocher incision was made with #15 blade. Dissection was carried through subcutaneous tissues and platysma. Hemostasis was achieved with the electrocautery. Skin flaps were elevated cephalad and caudad from the thyroid  notch to the sternal notch. A self-retaining retractor was placed for exposure. Strap muscles were incised in the midline and dissection was begun on the left side. Strap muscles were reflected laterally. The left thyroid  lobe was relatively normal with two dominant nodules attached posteriorly and inferiorly. The lobe was gently mobilized with blunt dissection. Superior pole vessels were dissected out and  divided individually between small and medium ligaclips with the harmonic scalpel. The thyroid  lobe was rolled anteriorly. Branches of the inferior thyroid  artery were divided between small ligaclips with the harmonic scalpel. Inferior venous tributaries were divided between ligaclips. Both the superior and inferior parathyroid glands were identified and preserved on their vascular pedicles. The recurrent laryngeal nerve was identified and preserved along its course. The ligament of Katheryne Pane was released with the electrocautery and the gland was mobilized onto the anterior trachea. Isthmus was mobilized across the midline. There was no significant pyramidal lobe present. The thyroid  parenchyma was transected at the junction of the isthmus and contralateral thyroid  lobe with the harmonic scalpel. A suture was used to mark the isthmus margin. The thyroid  lobe and isthmus were submitted to pathology for review.  The entire field was palpated for evidence of lymphadenopathy or extra-thyroidal disease.  No worrisome findings were noted.  No enlarged lymph nodes were identified.  The neck was irrigated with warm saline. Fibrillar was placed throughout the operative field. Strap muscles were approximated in the midline with interrupted 3-0 Vicryl sutures. Platysma was closed with interrupted 3-0 Vicryl sutures. Skin was closed with a running 4-0 Monocryl subcuticular suture.  Wound was washed and dried and Dermabond was applied. The patient was awakened from anesthesia and brought to the recovery room. The patient tolerated the procedure well.   Oralee Billow, MD [facility] Surgery Office: [phone]

## 2024-01-11 NOTE — Anesthesia Preprocedure Evaluation (Signed)
 Anesthesia Evaluation  Patient identified by MRN, date of birth, ID band Patient awake    Reviewed: Allergy & Precautions, H&P , NPO status , Patient's Chart, lab work & pertinent test results, reviewed documented beta blocker date and time   Airway Mallampati: II  TM Distance: >3 FB Neck ROM: full    Dental no notable dental hx.    Pulmonary neg pulmonary ROS   Pulmonary exam normal breath sounds clear to auscultation       Cardiovascular Exercise Tolerance: Good negative cardio ROS  Rhythm:regular Rate:Normal     Neuro/Psych negative neurological ROS  negative psych ROS   GI/Hepatic Neg liver ROS,GERD  ,,  Endo/Other  negative endocrine ROS    Renal/GU negative Renal ROS  negative genitourinary   Musculoskeletal  (+) Arthritis , Osteoarthritis,    Abdominal Normal abdominal exam  (+)   Peds  Hematology negative hematology ROS (+)   Anesthesia Other Findings   Reproductive/Obstetrics negative OB ROS                             Anesthesia Physical Anesthesia Plan  ASA: 2  Anesthesia Plan: General   Post-op Pain Management:    Induction: Intravenous  PONV Risk Score and Plan: 3 and Ondansetron , Dexamethasone, Midazolam  and Treatment may vary due to age or medical condition  Airway Management Planned: Oral ETT  Additional Equipment:   Intra-op Plan:   Post-operative Plan: Extubation in OR  Informed Consent: I have reviewed the patients History and Physical, chart, labs and discussed the procedure including the risks, benefits and alternatives for the proposed anesthesia with the patient or authorized representative who has indicated his/her understanding and acceptance.     Dental Advisory Given  Plan Discussed with: CRNA  Anesthesia Plan Comments:         Anesthesia Quick Evaluation

## 2024-01-11 NOTE — Interval H&P Note (Signed)
 History and Physical Interval Note:  01/11/2024 8:59 AM  Jessica Osborn  has presented today for surgery, with the diagnosis of RIGHT THYROID  NODULE TRACHEAL DEVIATION.  The various methods of treatment have been discussed with the patient and family. After consideration of risks, benefits and other options for treatment, the patient has consented to    Procedure(s) with comments: LOBECTOMY, THYROID  (Right) - RIGHT THYROID  LOBECTOMY as a surgical intervention.    The patient's history has been reviewed, patient examined, no change in status, stable for surgery.  I have reviewed the patient's chart and labs.  Questions were answered to the patient's satisfaction.    Oralee Billow, MD [facility] practice Office: [phone]   Oralee Billow

## 2024-01-11 NOTE — Discharge Instructions (Signed)

## 2024-01-11 NOTE — Anesthesia Procedure Notes (Signed)
 Procedure Name: Intubation Date/Time: 01/11/2024 9:37 AM  Performed by: Terrilee Few, RNPre-anesthesia Checklist: Patient identified, Emergency Drugs available, Suction available, Patient being monitored and Timeout performed Patient Re-evaluated:Patient Re-evaluated prior to induction Oxygen Delivery Method: Circle system utilized Preoxygenation: Pre-oxygenation with 100% oxygen Induction Type: IV induction Ventilation: Mask ventilation without difficulty Laryngoscope Size: Mac and 3 Grade View: Grade I Tube type: Oral Tube size: 7.0 mm Number of attempts: 1 Airway Equipment and Method: Stylet Placement Confirmation: ETT inserted through vocal cords under direct vision, positive ETCO2 and breath sounds checked- equal and bilateral Secured at: 20 cm Tube secured with: Tape Dental Injury: Teeth and Oropharynx as per pre-operative assessment

## 2024-01-12 ENCOUNTER — Encounter (HOSPITAL_COMMUNITY): Payer: Self-pay | Admitting: Surgery

## 2024-01-12 DIAGNOSIS — K219 Gastro-esophageal reflux disease without esophagitis: Secondary | ICD-10-CM | POA: Diagnosis not present

## 2024-01-12 DIAGNOSIS — D34 Benign neoplasm of thyroid gland: Secondary | ICD-10-CM | POA: Diagnosis not present

## 2024-01-12 DIAGNOSIS — E042 Nontoxic multinodular goiter: Secondary | ICD-10-CM | POA: Diagnosis not present

## 2024-01-12 DIAGNOSIS — M1990 Unspecified osteoarthritis, unspecified site: Secondary | ICD-10-CM | POA: Diagnosis not present

## 2024-01-12 DIAGNOSIS — E041 Nontoxic single thyroid nodule: Secondary | ICD-10-CM | POA: Diagnosis not present

## 2024-01-12 DIAGNOSIS — J398 Other specified diseases of upper respiratory tract: Secondary | ICD-10-CM | POA: Diagnosis not present

## 2024-01-12 NOTE — Plan of Care (Signed)

## 2024-01-12 NOTE — TOC Initial Note (Signed)
 Transition of Care [name]) - Initial/Assessment Note    Patient Details  Name: Jessica Osborn MRN: [id number] Date of Birth: 05-22-1946  [facility]) CM/SW Contact:    Levie Ream, RN Phone Number: 01/12/2024, 9:41 AM  Clinical Narrative:                 Spoke w/ pt; she lives at home w/ her spouse Gea Halbrook [phone]); she plans to return at d/c; her husband will provide transportation; she verified insurance/PCP; pt denied SDOH risks; pt says she does not have DME, HH services, or home oxygen; no TOC needs.  Expected Discharge Plan: Home/Self Care Barriers to Discharge: No Barriers Identified   Patient Goals and CMS Choice Patient states their goals for this hospitalization and ongoing recovery are:: home CMS Medicare.gov Compare Post Acute Care list provided to:: Patient        Expected Discharge Plan and Services   Discharge Planning Services: CM Consult Post Acute Care Choice: NA Living arrangements for the past 2 months: Single Family Home Expected Discharge Date: 01/12/24               DME Arranged: N/A DME Agency: NA       HH Arranged: NA HH Agency: NA        Prior Living Arrangements/Services Living arrangements for the past 2 months: Single Family Home Lives with:: Spouse Patient language and need for interpreter reviewed:: Yes Do you feel safe going back to the place where you live?: Yes      Need for Family Participation in Patient Care: Yes (Comment) Care giver support system in place?: Yes (comment) Current home services:  (n/a) Criminal Activity/Legal Involvement Pertinent to Current Situation/Hospitalization: No - Comment as needed  Activities of Daily Living   ADL Screening (condition at time of admission) Independently performs ADLs?: Yes (appropriate for developmental age) Is the patient deaf or have difficulty hearing?: No Does the patient have difficulty seeing, even when wearing glasses/contacts?: No Does the  patient have difficulty concentrating, remembering, or making decisions?: No  Permission Sought/Granted Permission sought to share information with : Case Manager Permission granted to share information with : Yes, Verbal Permission Granted  Share Information with NAME: Case Manager     Permission granted to share info w Relationship: Alexyz Gut (spouse) [phone]     Emotional Assessment Appearance:: Appears stated age Attitude/Demeanor/Rapport: Gracious Affect (typically observed): Accepting Orientation: : Oriented to Self, Oriented to Place, Oriented to  Time, Oriented to Situation Alcohol / Substance Use: Not Applicable Psych Involvement: No (comment)  Admission diagnosis:  Thyroid  nodule [E04.1] 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 [place] [address] Phone: [phone] Fax: [phone]     Social Drivers of Health (SDOH) Social History: SDOH Screenings   Food Insecurity: No Food Insecurity (01/12/2024)  Housing: Low Risk  (01/12/2024)  Transportation Needs: No Transportation Needs (01/12/2024)  Utilities: Not At Risk (01/12/2024)  Social Connections: Unknown (01/11/2024)  Tobacco Use: Low Risk  (01/11/2024)   SDOH Interventions: Food Insecurity Interventions: Intervention Not Indicated, Inpatient TOC Housing Interventions: Intervention Not Indicated, Inpatient TOC Transportation Interventions: Intervention Not Indicated,  Inpatient TOC Utilities Interventions: Intervention Not Indicated, Inpatient TOC   Readmission Risk Interventions     No data to display

## 2024-01-12 NOTE — Progress Notes (Signed)
 Reviewed written d/c instructions w Patient, all questions answered, they verbalized understanding. D/C via w/c w all belongings in stable condition.

## 2024-01-14 ENCOUNTER — Encounter: Payer: Self-pay | Admitting: Surgery

## 2024-01-14 LAB — SURGICAL PATHOLOGY

## 2024-01-14 NOTE — Progress Notes (Signed)
 Pathology is benign, as expected.  Darnell Level, MD [facility] practice Office: [phone]

## 2024-02-12 DIAGNOSIS — E041 Nontoxic single thyroid nodule: Secondary | ICD-10-CM | POA: Diagnosis not present

## 2024-02-12 DIAGNOSIS — Z9009 Acquired absence of other part of head and neck: Secondary | ICD-10-CM | POA: Diagnosis not present

## 2024-02-25 DIAGNOSIS — L7211 Pilar cyst: Secondary | ICD-10-CM | POA: Diagnosis not present

## 2024-02-25 DIAGNOSIS — L905 Scar conditions and fibrosis of skin: Secondary | ICD-10-CM | POA: Diagnosis not present

## 2024-02-25 DIAGNOSIS — D2262 Melanocytic nevi of left upper limb, including shoulder: Secondary | ICD-10-CM | POA: Diagnosis not present

## 2024-02-25 DIAGNOSIS — L821 Other seborrheic keratosis: Secondary | ICD-10-CM | POA: Diagnosis not present

## 2024-03-25 DIAGNOSIS — M1711 Unilateral primary osteoarthritis, right knee: Secondary | ICD-10-CM | POA: Diagnosis not present

## 2024-04-09 ENCOUNTER — Other Ambulatory Visit: Payer: Self-pay | Admitting: Obstetrics and Gynecology

## 2024-04-09 DIAGNOSIS — Z1231 Encounter for screening mammogram for malignant neoplasm of breast: Secondary | ICD-10-CM

## 2024-04-11 DIAGNOSIS — E559 Vitamin D deficiency, unspecified: Secondary | ICD-10-CM | POA: Diagnosis not present

## 2024-04-11 DIAGNOSIS — E7800 Pure hypercholesterolemia, unspecified: Secondary | ICD-10-CM | POA: Diagnosis not present

## 2024-04-11 DIAGNOSIS — E7849 Other hyperlipidemia: Secondary | ICD-10-CM | POA: Diagnosis not present

## 2024-04-11 DIAGNOSIS — Z1212 Encounter for screening for malignant neoplasm of rectum: Secondary | ICD-10-CM | POA: Diagnosis not present

## 2024-04-21 DIAGNOSIS — M1711 Unilateral primary osteoarthritis, right knee: Secondary | ICD-10-CM | POA: Diagnosis not present

## 2024-04-22 ENCOUNTER — Ambulatory Visit
Admission: RE | Admit: 2024-04-22 | Discharge: 2024-04-22 | Disposition: A | Discharge status: Home / Self Care | Source: Ambulatory Visit

## 2024-04-22 DIAGNOSIS — Z1331 Encounter for screening for depression: Secondary | ICD-10-CM | POA: Diagnosis not present

## 2024-04-22 DIAGNOSIS — Z0000 Encounter for general adult medical examination without abnormal findings: Secondary | ICD-10-CM | POA: Diagnosis not present

## 2024-04-22 DIAGNOSIS — R030 Elevated blood-pressure reading, without diagnosis of hypertension: Secondary | ICD-10-CM | POA: Diagnosis not present

## 2024-04-22 DIAGNOSIS — Z23 Encounter for immunization: Secondary | ICD-10-CM | POA: Diagnosis not present

## 2024-04-22 DIAGNOSIS — E041 Nontoxic single thyroid nodule: Secondary | ICD-10-CM | POA: Diagnosis not present

## 2024-04-22 DIAGNOSIS — Z1231 Encounter for screening mammogram for malignant neoplasm of breast: Secondary | ICD-10-CM

## 2024-04-22 DIAGNOSIS — R82998 Other abnormal findings in urine: Secondary | ICD-10-CM | POA: Diagnosis not present

## 2024-04-22 DIAGNOSIS — Z1339 Encounter for screening examination for other mental health and behavioral disorders: Secondary | ICD-10-CM | POA: Diagnosis not present

## 2024-05-19 DIAGNOSIS — R059 Cough, unspecified: Secondary | ICD-10-CM | POA: Diagnosis not present

## 2024-05-19 DIAGNOSIS — U071 COVID-19: Secondary | ICD-10-CM | POA: Diagnosis not present

## 2024-05-26 DIAGNOSIS — R109 Unspecified abdominal pain: Secondary | ICD-10-CM | POA: Diagnosis not present

## 2024-06-19 NOTE — Progress Notes (Signed)
 Sent message, via epic in basket, requesting orders in epic from Careers adviser.

## 2024-06-20 DIAGNOSIS — M25511 Pain in right shoulder: Secondary | ICD-10-CM | POA: Diagnosis not present

## 2024-06-20 DIAGNOSIS — M25512 Pain in left shoulder: Secondary | ICD-10-CM | POA: Diagnosis not present

## 2024-06-24 NOTE — Progress Notes (Addendum)
 COVID Vaccine received:  []  No [x]  Yes Date of any COVID positive Test in last 90 days: Yes- Sept. 4,  2025  PCP - Dr. Richard Tisovec Cardiologist -   Chest x-ray -  EKG -   Stress Test -  ECHO -  Cardiac Cath -   Bowel Prep - [x]  No  []   Yes ______  Pacemaker / ICD device [x]  No []  Yes   Spinal Cord Stimulator:[x]  No []  Yes       History of Sleep Apnea? [x]  No []  Yes   CPAP used?- [x]  No []  Yes    Does the patient monitor blood sugar?          [x]  No []  Yes  []  N/A  Patient has: [x]  NO Hx DM   []  Pre-DM                 []  DM1  []   DM2 Does patient have a [employer] or [employer]? []  No []  Yes   Fasting Blood Sugar Ranges-  Checks Blood Sugar _____ times a day  GLP1 agonist / usual dose - no GLP1 instructions:  SGLT-2 inhibitors / usual dose - no SGLT-2 instructions:   Blood Thinner / Instructions:no Aspirin Instructions:no  Comments:   Activity level: Patient is able to climb a flight of stairs without difficulty; [x]  No CP  [x]  No SOB,    Patient can perform ADLs without assistance.   Anesthesia review:   Patient denies shortness of breath, fever, cough and chest pain at PAT appointment.  Patient verbalized understanding and agreement to the Pre-Surgical Instructions that were given to them at this PAT appointment. Patient was also educated of the need to review these PAT instructions again prior to his/her surgery.I reviewed the appropriate phone numbers to call if they have any and questions or concerns.

## 2024-06-24 NOTE — Patient Instructions (Addendum)
 SURGICAL WAITING ROOM VISITATION  Patients having surgery or a procedure may have no more than 2 support people in the waiting area - these visitors may rotate.    Children under the age of 52 must have an adult with them who is not the patient.  Visitors with respiratory illnesses are discouraged from visiting and should remain at home.  If the patient needs to stay at the hospital during part of their recovery, the visitor guidelines for inpatient rooms apply. Pre-op nurse will coordinate an appropriate time for 1 support person to accompany patient in pre-op.  This support person may not rotate.    Please refer to the [facility] website for the visitor guidelines for Inpatients (after your surgery is over and you are in a regular room).       Your procedure is scheduled on: 07/08/24   Report to [facility] Main Entrance    Report to admitting at 12:40 PM   Call this number if you have problems the morning of surgery [phone]   Do not eat food :After Midnight.   After Midnight you may have the following liquids until 12 noon DAY OF SURGERY  Water Non-Citrus Juices (without pulp, NO RED-Apple, White grape, White cranberry) Black Coffee (NO MILK/CREAM OR CREAMERS, sugar ok)  Clear Tea (NO MILK/CREAM OR CREAMERS, sugar ok) regular and decaf                             Plain Jell-O (NO RED)                                           Fruit ices (not with fruit pulp, NO RED)                                     Popsicles (NO RED)                                                               Sports drinks like Gatorade (NO RED)                Oral Hygiene is also important to reduce your risk of infection.                                    Remember - BRUSH YOUR TEETH THE MORNING OF SURGERY WITH YOUR REGULAR TOOTHPASTE    Stop all vitamins and herbal supplements 7 days before surgery.   Take these medicines the morning of surgery with A SIP OF WATER: Tylenol  if  needed, atorvastatin, fexofenadine, nasal spray, pantoprazole .              You may not have any metal on your body including hair pins, jewelry, and body piercing             Do not wear make-up, lotions, powders, perfumes/cologne, or deodorant  Do not wear nail polish including gel and S&S, artificial/acrylic nails, or any other type of  covering on natural nails including finger and toenails. If you have artificial nails, gel coating, etc. that needs to be removed by a nail salon please have this removed prior to surgery or surgery may need to be canceled/ delayed if the surgeon/ anesthesia feels like they are unable to be safely monitored.   Do not shave  48 hours prior to surgery.             Do not bring valuables to the hospital. [facility] IS NOT             RESPONSIBLE   FOR VALUABLES.   Contacts, glasses, dentures or bridgework may not be worn into surgery.   Bring small overnight bag day of surgery.   DO NOT BRING YOUR HOME MEDICATIONS TO THE HOSPITAL. PHARMACY WILL DISPENSE MEDICATIONS LISTED ON YOUR MEDICATION LIST TO YOU DURING YOUR ADMISSION IN THE HOSPITAL!    Patients discharged on the day of surgery will not be allowed to drive home.  Someone NEEDS to stay with you for the first 24 hours after anesthesia.   Special Instructions: Bring a copy of your healthcare power of attorney and living will documents the day of surgery if you haven't scanned them before.              Please read over the following fact sheets you were given: IF YOU HAVE QUESTIONS ABOUT YOUR PRE-OP INSTRUCTIONS PLEASE CALL [phone] Jessica Osborn.   If you received a COVID test during your pre-op visit  it is requested that you wear a mask when out in public, stay away from anyone that may not be feeling well and notify your surgeon if you develop symptoms. If you test positive for Covid or have been in contact with anyone that has tested positive in the last 10 days please notify you surgeon.       Pre-operative 4 CHG Bath Instructions  DYNA-Hex 4 Chlorhexidine  Gluconate 4% Solution Antiseptic 4 fl. oz   You can play a key role in reducing the risk of infection after surgery. Your skin needs to be as free of germs as possible. You can reduce the number of germs on your skin by washing with CHG (chlorhexidine  gluconate) soap before surgery. CHG is an antiseptic soap that kills germs and continues to kill germs even after washing.   DO NOT use if you have an allergy to chlorhexidine /CHG or antibacterial soaps. If your skin becomes reddened or irritated, stop using the CHG and notify one of our RNs at   Please shower with the CHG soap starting 4 days before surgery using the following schedule:     Please keep in mind the following:  DO NOT shave, including legs and underarms, starting the day of your first shower.   You may shave your face at any point before/day of surgery.  Place clean sheets on your bed the day you start using CHG soap. Use a clean washcloth (not used since being washed) for each shower. DO NOT sleep with pets once you start using the CHG.  CHG Shower Instructions:  If you choose to wash your hair and private area, wash first with your normal shampoo/soap.  After you use shampoo/soap, rinse your hair and body thoroughly to remove shampoo/soap residue.  Turn the water OFF and apply about 3 tablespoons (45 ml) of CHG soap to a CLEAN washcloth.  Apply CHG soap ONLY FROM YOUR NECK DOWN TO YOUR TOES (washing for 3-5 minutes)  DO NOT use  CHG soap on face, private areas, open wounds, or sores.  Pay special attention to the area where your surgery is being performed.  If you are having back surgery, having someone wash your back for you may be helpful. Wait 2 minutes after CHG soap is applied, then you may rinse off the CHG soap.  Pat dry with a clean towel  Put on clean clothes/pajamas   If you choose to wear lotion, please use ONLY the CHG-compatible lotions on the  back of this paper.     Additional instructions for the day of surgery: DO NOT APPLY any lotions, deodorants, cologne, or perfumes.   Put on clean/comfortable clothes.  Brush your teeth.  Ask your nurse before applying any prescription medications to the skin.   CHG Compatible Lotions   Aveeno Moisturizing lotion  Cetaphil Moisturizing Cream  Cetaphil Moisturizing Lotion  Clairol Herbal Essence Moisturizing Lotion, Dry Skin  Clairol Herbal Essence Moisturizing Lotion, Extra Dry Skin  Clairol Herbal Essence Moisturizing Lotion, Normal Skin  Curel Age Defying Therapeutic Moisturizing Lotion with Alpha Hydroxy  Curel Extreme Care Body Lotion  Curel Soothing Hands Moisturizing Hand Lotion  Curel Therapeutic Moisturizing Cream, Fragrance-Free  Curel Therapeutic Moisturizing Lotion, Fragrance-Free  Curel Therapeutic Moisturizing Lotion, Original Formula  Eucerin Daily Replenishing Lotion  Eucerin Dry Skin Therapy Plus Alpha Hydroxy Crme  Eucerin Dry Skin Therapy Plus Alpha Hydroxy Lotion  Eucerin Original Crme  Eucerin Original Lotion  Eucerin Plus Crme Eucerin Plus Lotion  Eucerin TriLipid Replenishing Lotion  Keri Anti-Bacterial Hand Lotion  Keri Deep Conditioning Original Lotion Dry Skin Formula Softly Scented  Keri Deep Conditioning Original Lotion, Fragrance Free Sensitive Skin Formula  Keri Lotion Fast Absorbing Fragrance Free Sensitive Skin Formula  Keri Lotion Fast Absorbing Softly Scented Dry Skin Formula  Keri Original Lotion  Keri Skin Renewal Lotion Keri Silky Smooth Lotion  Keri Silky Smooth Sensitive Skin Lotion  Nivea Body Creamy Conditioning Oil  Nivea Body Extra Enriched Teacher, adult education Moisturizing Lotion Nivea Crme  Nivea Skin Firming Lotion  NutraDerm 30 Skin Lotion  NutraDerm Skin Lotion  NutraDerm Therapeutic Skin Cream  NutraDerm Therapeutic Skin Lotion  ProShield Protective Hand Cream  Provon moisturizing  lotion

## 2024-06-24 NOTE — Progress Notes (Signed)
 Request sent to Dr. Ernie to send pre op orders for PST visit 06/26/24.

## 2024-06-24 NOTE — Progress Notes (Signed)
 Second request for pre op orders sent message via epic to [facility].

## 2024-06-26 ENCOUNTER — Other Ambulatory Visit: Payer: Self-pay

## 2024-06-26 ENCOUNTER — Encounter (HOSPITAL_COMMUNITY): Payer: Self-pay | Admitting: Physician Assistant

## 2024-06-26 ENCOUNTER — Encounter (HOSPITAL_COMMUNITY)
Admission: RE | Admit: 2024-06-26 | Discharge: 2024-06-26 | Disposition: A | Discharge status: Home / Self Care | Source: Ambulatory Visit | Attending: Orthopedic Surgery | Admitting: Orthopedic Surgery

## 2024-06-26 ENCOUNTER — Encounter (HOSPITAL_COMMUNITY): Payer: Self-pay

## 2024-06-26 VITALS — BP 167/79 | HR 65 | Temp 98.2°F | Resp 16 | Ht 60.0 in | Wt 115.0 lb

## 2024-06-26 DIAGNOSIS — Z01812 Encounter for preprocedural laboratory examination: Secondary | ICD-10-CM | POA: Insufficient documentation

## 2024-06-26 DIAGNOSIS — Z01818 Encounter for other preprocedural examination: Secondary | ICD-10-CM

## 2024-06-26 LAB — CBC
HCT: 42.5 % (ref 36.0–46.0)
Hemoglobin: 13.7 g/dL (ref 12.0–15.0)
MCH: 29.9 pg (ref 26.0–34.0)
MCHC: 32.2 g/dL (ref 30.0–36.0)
MCV: 92.8 fL (ref 80.0–100.0)
Platelets: 223 K/uL (ref 150–400)
RBC: 4.58 MIL/uL (ref 3.87–5.11)
RDW: 12.9 % (ref 11.5–15.5)
WBC: 9.4 K/uL (ref 4.0–10.5)
nRBC: 0 % (ref 0.0–0.2)

## 2024-06-26 LAB — SURGICAL PCR SCREEN
MRSA, PCR: NEGATIVE
Staphylococcus aureus: NEGATIVE

## 2024-06-29 ENCOUNTER — Other Ambulatory Visit: Payer: Self-pay

## 2024-06-29 ENCOUNTER — Emergency Department (HOSPITAL_BASED_OUTPATIENT_CLINIC_OR_DEPARTMENT_OTHER)
Admission: EM | Admit: 2024-06-29 | Discharge: 2024-06-29 | Disposition: A | Discharge status: Home / Self Care | Attending: Emergency Medicine | Admitting: Emergency Medicine

## 2024-06-29 ENCOUNTER — Encounter (HOSPITAL_BASED_OUTPATIENT_CLINIC_OR_DEPARTMENT_OTHER): Payer: Self-pay | Admitting: Emergency Medicine

## 2024-06-29 ENCOUNTER — Emergency Department (HOSPITAL_BASED_OUTPATIENT_CLINIC_OR_DEPARTMENT_OTHER)

## 2024-06-29 DIAGNOSIS — J189 Pneumonia, unspecified organism: Secondary | ICD-10-CM | POA: Insufficient documentation

## 2024-06-29 DIAGNOSIS — D259 Leiomyoma of uterus, unspecified: Secondary | ICD-10-CM | POA: Diagnosis not present

## 2024-06-29 DIAGNOSIS — R1032 Left lower quadrant pain: Secondary | ICD-10-CM | POA: Diagnosis not present

## 2024-06-29 DIAGNOSIS — R109 Unspecified abdominal pain: Secondary | ICD-10-CM

## 2024-06-29 DIAGNOSIS — K5730 Diverticulosis of large intestine without perforation or abscess without bleeding: Secondary | ICD-10-CM | POA: Diagnosis not present

## 2024-06-29 LAB — URINALYSIS, ROUTINE W REFLEX MICROSCOPIC
Bacteria, UA: NONE SEEN
Bilirubin Urine: NEGATIVE
Glucose, UA: NEGATIVE mg/dL
Ketones, ur: NEGATIVE mg/dL
Leukocytes,Ua: NEGATIVE
Nitrite: NEGATIVE
Protein, ur: 30 mg/dL — AB
Specific Gravity, Urine: 1.026 (ref 1.005–1.030)
pH: 6.5 (ref 5.0–8.0)

## 2024-06-29 LAB — CBC
HCT: 42.2 % (ref 36.0–46.0)
Hemoglobin: 14 g/dL (ref 12.0–15.0)
MCH: 29.9 pg (ref 26.0–34.0)
MCHC: 33.2 g/dL (ref 30.0–36.0)
MCV: 90.2 fL (ref 80.0–100.0)
Platelets: 189 K/uL (ref 150–400)
RBC: 4.68 MIL/uL (ref 3.87–5.11)
RDW: 12.9 % (ref 11.5–15.5)
WBC: 10.7 K/uL — ABNORMAL HIGH (ref 4.0–10.5)
nRBC: 0 % (ref 0.0–0.2)

## 2024-06-29 LAB — COMPREHENSIVE METABOLIC PANEL WITH GFR
ALT: 23 U/L (ref 0–44)
AST: 28 U/L (ref 15–41)
Albumin: 4.4 g/dL (ref 3.5–5.0)
Alkaline Phosphatase: 87 U/L (ref 38–126)
Anion gap: 14 (ref 5–15)
BUN: 8 mg/dL (ref 8–23)
CO2: 24 mmol/L (ref 22–32)
Calcium: 9.9 mg/dL (ref 8.9–10.3)
Chloride: 97 mmol/L — ABNORMAL LOW (ref 98–111)
Creatinine, Ser: 0.75 mg/dL (ref 0.44–1.00)
GFR, Estimated: >60 mL/min (ref >60)
Glucose, Bld: 107 mg/dL — ABNORMAL HIGH (ref 70–99)
Potassium: 4.2 mmol/L (ref 3.5–5.1)
Sodium: 135 mmol/L (ref 135–145)
Total Bilirubin: 0.5 mg/dL (ref 0.0–1.2)
Total Protein: 7.8 g/dL (ref 6.5–8.1)

## 2024-06-29 LAB — LIPASE, BLOOD: Lipase: 22 U/L (ref 11–51)

## 2024-06-29 MED ORDER — IOHEXOL 300 MG/ML  SOLN
75.0000 mL | Freq: Once | INTRAMUSCULAR | Status: AC | PRN
  Administered 2024-06-29: 75 mL via INTRAVENOUS

## 2024-06-29 MED ORDER — DOXYCYCLINE HYCLATE 100 MG PO TABS
100.0000 mg | ORAL_TABLET | Freq: Once | ORAL | Status: AC
  Administered 2024-06-29: 100 mg via ORAL
  Filled 2024-06-29: qty 1

## 2024-06-29 MED ORDER — DOXYCYCLINE HYCLATE 100 MG PO CAPS: 100.0000 mg | ORAL_CAPSULE | Freq: Two times a day (BID) | ORAL | 0 refills | Status: DC

## 2024-06-29 NOTE — ED Provider Notes (Signed)
 [place] EMERGENCY DEPARTMENT AT [facility] Provider Note   CSN: [id number] Arrival date & time: 06/29/24  1135     Patient presents with: Abdominal Pain   Jessica Osborn is a 78 y.o. female.   Patient here with left lower abdominal pain.  History of diverticulosis.  Denies any history of diverticulitis.  Been on and off for about a week.  She has had some chills.  No nausea vomiting diarrhea.  History of high cholesterol shortness of breath.  Denies any pain with urination.  Denies any flank pain.  No major abdominal surgery history.  The history is provided by the patient.       Prior to Admission medications   Medication Sig Start Date End Date Taking? Authorizing Provider  doxycycline (VIBRAMYCIN) 100 MG capsule Take 1 capsule (100 mg total) by mouth 2 (two) times daily. 06/29/24  Yes Kaylan Friedmann, DO  acetaminophen  (TYLENOL ) 500 MG tablet Take 500 mg by mouth every 6 (six) hours as needed for moderate pain (pain score 4-6).    [provider]  atorvastatin (LIPITOR) 20 MG tablet Take 20 mg by mouth every other day. In the morning    [provider]  bifidobacterium infantis (ALIGN) capsule Take 1 capsule by mouth daily. 02/26/13   Esterwood, Amy S, PA-C  BIOTIN PO Take 1 tablet by mouth 4 (four) times a week.    [provider]  celecoxib (CELEBREX) 100 MG capsule Take 100 mg by mouth 2 (two) times daily as needed (pain).    [provider]  Cholecalciferol (VITAMIN D3) 50 MCG (2000 UT) TABS Take 2,000 Units by mouth in the morning.    [provider]  fexofenadine (ALLEGRA) 180 MG tablet Take 180 mg by mouth daily.    [provider]  fluticasone (FLONASE) 50 MCG/ACT nasal spray Place 2 sprays into both nostrils daily as needed for allergies. 02/23/20   [provider]  ibandronate (BONIVA) 150 MG tablet Take 150 mg by mouth every 30 (thirty) days. 04/12/23   [provider]  ibuprofen (ADVIL)  200 MG tablet Take 200 mg by mouth every 8 (eight) hours as needed for moderate pain (pain score 4-6).    [provider]  Magnesium 250 MG TABS Take 250 mg by mouth in the morning.    [provider]  methocarbamol (ROBAXIN) 500 MG tablet Take 500 mg by mouth 2 (two) times daily as needed for muscle spasms.    [provider]  Multiple Minerals-Vitamins (CITRACAL PLUS) TABS Take 2 tablets by mouth daily.    [provider]  pantoprazole  (PROTONIX ) 20 MG tablet Take 20 mg by mouth in the morning. 07/26/22   [provider]  Polyethyl Glyc-Propyl Glyc PF (SYSTANE ULTRA PF) 0.4-0.3 % SOLN Place 1-2 drops into both eyes 3 (three) times daily as needed (dry/irritated eyes.).    [provider]  Polyethylene Glycol 3350 (MIRALAX PO) Take 17 g by mouth daily as needed (constipation).    [provider]  vitamin E 400 UNIT capsule Take 400 Units by mouth in the morning.    [provider]    Allergies: Patient has no known allergies.    Review of Systems  Updated Vital Signs BP (!) 167/78   Pulse 81   Temp 99.4 F (37.4 C) (Oral)   Resp 18   Ht 5' (1.524 m)   Wt 52.2 kg   SpO2 100%   BMI 22.46 kg/m  Physical Exam Vitals and nursing note reviewed.  Constitutional:      General: She is not in acute distress.    Appearance: She is well-developed.  HENT:     Head: Normocephalic and atraumatic.  Eyes:     Extraocular Movements: Extraocular movements intact.     Conjunctiva/sclera: Conjunctivae normal.     Pupils: Pupils are equal, round, and reactive to light.  Cardiovascular:     Rate and Rhythm: Normal rate and regular rhythm.     Heart sounds: Normal heart sounds. No murmur heard. Pulmonary:     Effort: Pulmonary effort is normal. No respiratory distress.     Breath sounds: Normal breath sounds.  Abdominal:     Palpations: Abdomen is soft.     Tenderness: There is abdominal tenderness in the left lower  quadrant.  Musculoskeletal:        General: No swelling.     Cervical back: Neck supple.  Skin:    General: Skin is warm and dry.     Capillary Refill: Capillary refill takes less than 2 seconds.  Neurological:     Mental Status: She is alert.  Psychiatric:        Mood and Affect: Mood normal.     (all labs ordered are listed, but only abnormal results are displayed) Labs Reviewed  CBC - Abnormal; Notable for the following components:      Result Value   WBC 10.7 (*)    All other components within normal limits  URINALYSIS, ROUTINE W REFLEX MICROSCOPIC - Abnormal; Notable for the following components:   Hgb urine dipstick LARGE (*)    Protein, ur 30 (*)    All other components within normal limits  COMPREHENSIVE METABOLIC PANEL WITH GFR - Abnormal; Notable for the following components:   Chloride 97 (*)    Glucose, Bld 107 (*)    All other components within normal limits  LIPASE, BLOOD    EKG: None  Radiology: CT ABDOMEN PELVIS W CONTRAST Result Date: 06/29/2024 CLINICAL DATA:  Acute abdominal pain EXAM: CT ABDOMEN AND PELVIS WITH CONTRAST TECHNIQUE: Multidetector CT imaging of the abdomen and pelvis was performed using the standard protocol following bolus administration of intravenous contrast. RADIATION DOSE REDUCTION: This exam was performed according to the departmental dose-optimization program which includes automated exposure control, adjustment of the mA and/or kV according to patient size and/or use of iterative reconstruction technique. CONTRAST:  75mL OMNIPAQUE  IOHEXOL  300 MG/ML  SOLN COMPARISON:  07/20/2017 FINDINGS: Lower chest: Streaky left lower lobe airspace disease consistent with pneumonia. No effusion or pneumothorax. Hepatobiliary: No focal liver abnormality is seen. No gallstones, gallbladder wall thickening, or biliary dilatation. Pancreas: Multi-cystic mass centered within the pancreatic body measures 5.7 x 5.5 x 4.3 cm, increased in size since prior exam.  No acute inflammatory changes or pancreatic duct dilation. Spleen: Normal in size without focal abnormality. Adrenals/Urinary Tract: Adrenal glands are unremarkable. Kidneys are normal, without renal calculi, focal lesion, or hydronephrosis. Bladder is unremarkable. Stomach/Bowel: No bowel obstruction or ileus. Normal appendix right lower quadrant. Scattered colonic diverticulosis without diverticulitis. No bowel wall thickening or inflammatory change. Vascular/Lymphatic: Aortic atherosclerosis. No enlarged abdominal or pelvic lymph nodes. Reproductive: Fibroid within the uterine fundus again noted. No adnexal masses. Other: No free fluid or free intraperitoneal gas. No abdominal wall hernia. Musculoskeletal: No acute or destructive bony abnormalities. Reconstructed images demonstrate no additional findings. IMPRESSION: 1. Streaky left lower lobe consolidation most consistent with pneumonia. Follow-up x-ray 6-8 weeks after appropriate medical  management is recommended to document resolution. 2. Multi cystic pancreatic mass, slightly enlarged since prior study, previously characterized as serous cystadenoma on MRI 04/19/2016. If further imaging is desired, or if surgical intervention is a consideration, repeat dedicated nonemergent outpatient pancreatic MRI may be useful. 3. Uterine fibroid. 4.  Aortic Atherosclerosis (ICD10-I70.0). Electronically Signed   By: Ozell Daring M.D.   On: 06/29/2024 18:07     Procedures   Medications Ordered in the ED  doxycycline (VIBRA-TABS) tablet 100 mg (has no administration in time range)  iohexol  (OMNIPAQUE ) 300 MG/ML solution 75 mL (75 mLs Intravenous Contrast Given 06/29/24 1726)                                    Medical Decision Making Amount and/or Complexity of Data Reviewed Labs: ordered. Radiology: ordered.  Risk Prescription drug management.   Jessica Osborn is here with abdominal pain.  Normal vitals.  No fever.  History of high cholesterol.   Differential diverticulitis versus UTI versus less likely bowel obstruction colitis.  Will get CBC CMP lipase urinalysis CT scan abdomen pelvis.  She has declined any pain medicine or nausea medicine at this time.  Lab work overall shows no significant leukocytosis anemia or electrolyte abnormality.  Lipase normal.  CT scan with no acute findings.  May be pneumonia will treat with antibiotics.  She states maybe she had a dry cough.  She has known pancreatic mass.  Slightly enlarged from prior study which is an MRI from almost 10 years ago.  Will have her follow-up with her primary care doctor to consider getting a new MRI to further evaluate this area.  Is not having any major discomfort in this area.  She understands to talk to her primary care doctor about this.  Will treat her possible pneumonia with antibiotics.  She understands return precautions.  Discharge.  This chart was dictated using voice recognition software.  Despite best efforts to proofread,  errors can occur which can change the documentation meaning.      Final diagnoses:  Community acquired pneumonia, unspecified laterality  Abdominal pain, unspecified abdominal location    ED Discharge Orders          Ordered    doxycycline (VIBRAMYCIN) 100 MG capsule  2 times daily        06/29/24 1823               Ruthe Cornet, DO 06/29/24 1824

## 2024-06-29 NOTE — Discharge Instructions (Addendum)
 Take antibiotic as prescribed.  Follow-up with your primary care doctor to discuss may be further need to repeat MRI of your abdomen as we discussed.  Return if symptoms worsen.

## 2024-06-29 NOTE — ED Triage Notes (Signed)
 Pt caox4 ambulatory c/o lower abd cramping and chills x1 wk.

## 2024-07-02 DIAGNOSIS — H5213 Myopia, bilateral: Secondary | ICD-10-CM | POA: Diagnosis not present

## 2024-07-02 DIAGNOSIS — H18613 Keratoconus, stable, bilateral: Secondary | ICD-10-CM | POA: Diagnosis not present

## 2024-07-04 DIAGNOSIS — J189 Pneumonia, unspecified organism: Secondary | ICD-10-CM | POA: Diagnosis not present

## 2024-07-08 ENCOUNTER — Encounter (HOSPITAL_COMMUNITY): Admission: RE | Payer: Self-pay | Source: Home / Self Care

## 2024-07-08 ENCOUNTER — Ambulatory Visit (HOSPITAL_COMMUNITY): Admission: RE | Admit: 2024-07-08 | Source: Home / Self Care | Admitting: Orthopedic Surgery

## 2024-07-08 SURGERY — ARTHROPLASTY, KNEE, TOTAL
Anesthesia: Spinal | Site: Knee | Laterality: Right

## 2024-07-17 ENCOUNTER — Other Ambulatory Visit: Payer: Self-pay | Admitting: Registered Nurse

## 2024-07-17 DIAGNOSIS — D136 Benign neoplasm of pancreas: Secondary | ICD-10-CM

## 2024-07-18 ENCOUNTER — Encounter: Payer: Self-pay | Admitting: Registered Nurse

## 2024-07-30 ENCOUNTER — Ambulatory Visit
Admission: RE | Admit: 2024-07-30 | Discharge: 2024-07-30 | Disposition: A | Discharge status: Home / Self Care | Source: Ambulatory Visit | Attending: Registered Nurse | Admitting: Registered Nurse

## 2024-07-30 DIAGNOSIS — K5710 Diverticulosis of small intestine without perforation or abscess without bleeding: Secondary | ICD-10-CM | POA: Diagnosis not present

## 2024-07-30 DIAGNOSIS — D136 Benign neoplasm of pancreas: Secondary | ICD-10-CM

## 2024-07-30 MED ORDER — GADOPICLENOL 0.5 MMOL/ML IV SOLN
5.0000 mL | Freq: Once | INTRAVENOUS | Status: AC | PRN
  Administered 2024-07-30: 5 mL via INTRAVENOUS

## 2024-07-31 DIAGNOSIS — J189 Pneumonia, unspecified organism: Secondary | ICD-10-CM | POA: Diagnosis not present

## 2024-08-25 DIAGNOSIS — Z6823 Body mass index (BMI) 23.0-23.9, adult: Secondary | ICD-10-CM | POA: Diagnosis not present

## 2024-08-25 DIAGNOSIS — Z01419 Encounter for gynecological examination (general) (routine) without abnormal findings: Secondary | ICD-10-CM | POA: Diagnosis not present

## 2024-09-29 NOTE — Progress Notes (Signed)
 COVID Vaccine received:  []  No [x]  Yes Date of any COVID positive Test in last 90 days: none PCP - Charlie Reas MD Cardiologist - n/a  Chest x-ray -  EKG -   Stress Test -  ECHO -  Cardiac Cath -   Bowel Prep - [x]  No  []   Yes ______  Pacemaker / ICD device [x]  No []  Yes   Spinal Cord Stimulator:[x]  No []  Yes       History of Sleep Apnea? [x]  No []  Yes   CPAP used?- [x]  No []  Yes    Does the patient monitor blood sugar?          [x]  No []  Yes  []  N/A  Patient has: [x]  NO Hx DM   []  Pre-DM                 []  DM1  []   DM2 Does patient have a [employer] or Dexacom? []  No []  Yes   Fasting Blood Sugar Ranges-  Checks Blood Sugar _____ times a day  GLP1 agonist / usual dose - no GLP1 instructions:  SGLT-2 inhibitors / usual dose - no SGLT-2 instructions:   Blood Thinner / Instructions:no Aspirin Instructions:no  Comments:   Activity level: Patient is able  to climb a flight of stairs without difficulty; [x]  No CP  [x]  No SOB,   Patient can perform ADLs without assistance.   Anesthesia review:   Patient denies shortness of breath, fever, cough and chest pain at PAT appointment.  Patient verbalized understanding and agreement to the Pre-Surgical Instructions that were given to them at this PAT appointment. Patient was also educated of the need to review these PAT instructions again prior to his/her surgery.I reviewed the appropriate phone numbers to call if they have any and questions or concerns.

## 2024-09-29 NOTE — Patient Instructions (Signed)
 SURGICAL WAITING ROOM VISITATION  Patients having surgery or a procedure may have no more than 2 support people in the waiting area - these visitors may rotate.    Children ages 69 and under will not be able to visit patients in [facility] under most circumstances.   Visitors with respiratory illnesses are discouraged from visiting and should remain at home.  If the patient needs to stay at the hospital during part of their recovery, the visitor guidelines for inpatient rooms apply. Pre-op nurse will coordinate an appropriate time for 1 support person to accompany patient in pre-op.  This support person may not rotate.    Please refer to the [facility] website for the visitor guidelines for Inpatients (after your surgery is over and you are in a regular room).       Your procedure is scheduled on: 10/07/24   Report to [facility] Main Entrance    Report to admitting at 8:20 AM   Call this number if you have problems the morning of surgery [phone]   Do not eat food :After Midnight.   After Midnight you may have the following liquids until 7:50 AM DAY OF SURGERY  Water Non-Citrus Juices (without pulp, NO RED-Apple, White grape, White cranberry) Black Coffee (NO MILK/CREAM OR CREAMERS, sugar ok)  Clear Tea (NO MILK/CREAM OR CREAMERS, sugar ok) regular and decaf                             Plain Jell-O (NO RED)                                           Fruit ices (not with fruit pulp, NO RED)                                     Popsicles (NO RED)                                                               Sports drinks like Gatorade (NO RED)          The day of surgery:  Drink ONE (1) Pre-Surgery Clear Ensure at 7:50 AM the morning of surgery. Drink in one sitting. Do not sip.  This drink was given to you during your hospital  pre-op appointment visit. Nothing else to drink after completing the  Pre-Surgery Clear Ensure.    Oral Hygiene is also  important to reduce your risk of infection.                                    Remember - BRUSH YOUR TEETH THE MORNING OF SURGERY WITH YOUR REGULAR TOOTHPASTE  DENTURES WILL BE REMOVED PRIOR TO SURGERY PLEASE DO NOT APPLY Poly grip OR ADHESIVES!!!   Stop all vitamins and herbal supplements 7 days before surgery.   Take these medicines the morning of surgery with A SIP OF WATER: tylenol  if needed, atorvastatin(lipitor), nasal spray, pantoprazole (protonix )  You may not have any metal on your body including hair pins, jewelry, and body piercing             Do not wear make-up, lotions, powders, perfumes/cologne, or deodorant  Do not wear nail polish including gel and S&S, artificial/acrylic nails, or any other type of covering on natural nails including finger and toenails. If you have artificial nails, gel coating, etc. that needs to be removed by a nail salon please have this removed prior to surgery or surgery may need to be canceled/ delayed if the surgeon/ anesthesia feels like they are unable to be safely monitored.   Do not shave  48 hours prior to surgery.           Do not bring valuables to the hospital. [facility] IS NOT             RESPONSIBLE   FOR VALUABLES.   Contacts, glasses, dentures or bridgework may not be worn into surgery.   Bring small overnight bag day of surgery.   DO NOT BRING YOUR HOME MEDICATIONS TO THE HOSPITAL. PHARMACY WILL DISPENSE MEDICATIONS LISTED ON YOUR MEDICATION LIST TO YOU DURING YOUR ADMISSION IN THE HOSPITAL!    Patients discharged on the day of surgery will not be allowed to drive home.  Someone NEEDS to stay with you for the first 24 hours after anesthesia.   Special Instructions: Bring a copy of your healthcare power of attorney and living will documents the day of surgery if you haven't scanned them before.              Please read over the following fact sheets you were given: IF YOU HAVE QUESTIONS ABOUT YOUR PRE-OP INSTRUCTIONS  PLEASE CALL [phone] [place]   If you received a COVID test during your pre-op visit  it is requested that you wear a mask when out in public, stay away from anyone that may not be feeling well and notify your surgeon if you develop symptoms. If you test positive for Covid or have been in contact with anyone that has tested positive in the last 10 days please notify you surgeon.      Pre-operative 4 CHG Bath Instructions  DYNA-Hex 4 Chlorhexidine  Gluconate 4% Solution Antiseptic 4 fl. oz   You can play a key role in reducing the risk of infection after surgery. Your skin needs to be as free of germs as possible. You can reduce the number of germs on your skin by washing with CHG (chlorhexidine  gluconate) soap before surgery. CHG is an antiseptic soap that kills germs and continues to kill germs even after washing.   DO NOT use if you have an allergy to chlorhexidine /CHG or antibacterial soaps. If your skin becomes reddened or irritated, stop using the CHG and notify one of our RNs at   Please shower with the CHG soap starting 4 days before surgery using the following schedule:     Please keep in mind the following:  DO NOT shave, including legs and underarms, starting the day of your first shower.   You may shave your face at any point before/day of surgery.  Place clean sheets on your bed the day you start using CHG soap. Use a clean washcloth (not used since being washed) for each shower. DO NOT sleep with pets once you start using the CHG.  CHG Shower Instructions:  If you choose to wash your hair and private area, wash first with your normal shampoo/soap.  After  you use shampoo/soap, rinse your hair and body thoroughly to remove shampoo/soap residue.  Turn the water OFF and apply about 3 tablespoons (45 ml) of CHG soap to a CLEAN washcloth.  Apply CHG soap ONLY FROM YOUR NECK DOWN TO YOUR TOES (washing for 3-5 minutes)  DO NOT use CHG soap on face, private areas, open wounds, or  sores.  Pay special attention to the area where your surgery is being performed.  If you are having back surgery, having someone wash your back for you may be helpful. Wait 2 minutes after CHG soap is applied, then you may rinse off the CHG soap.  Pat dry with a clean towel  Put on clean clothes/pajamas   If you choose to wear lotion, please use ONLY the CHG-compatible lotions on the back of this paper.     Additional instructions for the day of surgery: DO NOT APPLY any lotions, deodorants, cologne, or perfumes.   Put on clean/comfortable clothes.  Brush your teeth.  Ask your nurse before applying any prescription medications to the skin.   CHG Compatible Lotions   Aveeno Moisturizing lotion  Cetaphil Moisturizing Cream  Cetaphil Moisturizing Lotion  Clairol Herbal Essence Moisturizing Lotion, Dry Skin  Clairol Herbal Essence Moisturizing Lotion, Extra Dry Skin  Clairol Herbal Essence Moisturizing Lotion, Normal Skin  Curel Age Defying Therapeutic Moisturizing Lotion with Alpha Hydroxy  Curel Extreme Care Body Lotion  Curel Soothing Hands Moisturizing Hand Lotion  Curel Therapeutic Moisturizing Cream, Fragrance-Free  Curel Therapeutic Moisturizing Lotion, Fragrance-Free  Curel Therapeutic Moisturizing Lotion, Original Formula  Eucerin Daily Replenishing Lotion  Eucerin Dry Skin Therapy Plus Alpha Hydroxy Crme  Eucerin Dry Skin Therapy Plus Alpha Hydroxy Lotion  Eucerin Original Crme  Eucerin Original Lotion  Eucerin Plus Crme Eucerin Plus Lotion  Eucerin TriLipid Replenishing Lotion  Keri Anti-Bacterial Hand Lotion  Keri Deep Conditioning Original Lotion Dry Skin Formula Softly Scented  Keri Deep Conditioning Original Lotion, Fragrance Free Sensitive Skin Formula  Keri Lotion Fast Absorbing Fragrance Free Sensitive Skin Formula  Keri Lotion Fast Absorbing Softly Scented Dry Skin Formula  Keri Original Lotion  Keri Skin Renewal Lotion Keri Silky Smooth Lotion  Keri Silky  Smooth Sensitive Skin Lotion  Nivea Body Creamy Conditioning Oil  Nivea Body Extra Enriched Lotion  Nivea Body Original Lotion  Nivea Body Sheer Moisturizing Lotion Nivea Crme  Nivea Skin Firming Lotion  NutraDerm 30 Skin Lotion  NutraDerm Skin Lotion  NutraDerm Therapeutic Skin Cream  NutraDerm Therapeutic Skin Lotion  ProShield Protective Hand Cream  Incentive Spirometer (Watch this video at home: [email or web address])  An incentive spirometer is a tool that can help keep your lungs clear and active. This tool measures how well you are filling your lungs with each breath. Taking long deep breaths may help reverse or decrease the chance of developing breathing (pulmonary) problems (especially infection) following: A long period of time when you are unable to move or be active. BEFORE THE PROCEDURE  If the spirometer includes an indicator to show your best effort, your nurse or respiratory therapist will set it to a desired goal. If possible, sit up straight or lean slightly forward. Try not to slouch. Hold the incentive spirometer in an upright position. INSTRUCTIONS FOR USE  Sit on the edge of your bed if possible, or sit up as far as you can in bed or on a chair. Hold the incentive spirometer in an upright position. Breathe out normally. Place the mouthpiece in your mouth and seal your lips  tightly around it. Breathe in slowly and as deeply as possible, raising the piston or the ball toward the top of the column. Hold your breath for 3-5 seconds or for as long as possible. Allow the piston or ball to fall to the bottom of the column. Remove the mouthpiece from your mouth and breathe out normally. Rest for a few seconds and repeat Steps 1 through 7 at least 10 times every 1-2 hours when you are awake. Take your time and take a few normal breaths between deep breaths. The spirometer may include an indicator to show your best effort. Use the indicator as a goal  to work toward during each repetition. After each set of 10 deep breaths, practice coughing to be sure your lungs are clear. If you have an incision (the cut made at the time of surgery), support your incision when coughing by placing a pillow or rolled up towels firmly against it. Once you are able to get out of bed, walk around indoors and cough well. You may stop using the incentive spirometer when instructed by your caregiver.  RISKS AND COMPLICATIONS Take your time so you do not get dizzy or light-headed. If you are in pain, you may need to take or ask for pain medication before doing incentive spirometry. It is harder to take a deep breath if you are having pain. AFTER USE Rest and breathe slowly and easily. It can be helpful to keep track of a log of your progress. Your caregiver can provide you with a simple table to help with this. If you are using the spirometer at home, follow these instructions: SEEK MEDICAL CARE IF:  You are having difficultly using the spirometer. You have trouble using the spirometer as often as instructed. Your pain medication is not giving enough relief while using the spirometer. You develop fever of 100.5 F (38.1 C) or higher. SEEK IMMEDIATE MEDICAL CARE IF:  You cough up bloody sputum that had not been present before. You develop fever of 102 F (38.9 C) or greater. You develop worsening pain at or near the incision site. MAKE SURE YOU:  Understand these instructions. Will watch your condition. Will get help right away if you are not doing well or get worse. Document Released: 01/08/2007 Document Revised: 11/20/2011 Document Reviewed: 03/11/2007 [facility] Patient Information 2014 [place], [place].

## 2024-09-30 ENCOUNTER — Encounter (HOSPITAL_COMMUNITY): Payer: Self-pay

## 2024-09-30 ENCOUNTER — Other Ambulatory Visit: Payer: Self-pay

## 2024-09-30 ENCOUNTER — Encounter (HOSPITAL_COMMUNITY)
Admission: RE | Admit: 2024-09-30 | Discharge: 2024-09-30 | Disposition: A | Discharge status: Home / Self Care | Source: Ambulatory Visit | Attending: Orthopedic Surgery | Admitting: Orthopedic Surgery

## 2024-09-30 VITALS — BP 173/83 | HR 64 | Temp 97.8°F | Resp 18 | Ht 60.0 in | Wt 112.0 lb

## 2024-09-30 DIAGNOSIS — Z01818 Encounter for other preprocedural examination: Secondary | ICD-10-CM

## 2024-09-30 DIAGNOSIS — Z01812 Encounter for preprocedural laboratory examination: Secondary | ICD-10-CM | POA: Insufficient documentation

## 2024-09-30 LAB — CBC
HCT: 44 % (ref 36.0–46.0)
Hemoglobin: 14.4 g/dL (ref 12.0–15.0)
MCH: 30 pg (ref 26.0–34.0)
MCHC: 32.7 g/dL (ref 30.0–36.0)
MCV: 91.7 fL (ref 80.0–100.0)
Platelets: 244 K/uL (ref 150–400)
RBC: 4.8 MIL/uL (ref 3.87–5.11)
RDW: 13.2 % (ref 11.5–15.5)
WBC: 8.9 K/uL (ref 4.0–10.5)
nRBC: 0 % (ref 0.0–0.2)

## 2024-09-30 LAB — SURGICAL PCR SCREEN
MRSA, PCR: NEGATIVE
Staphylococcus aureus: NEGATIVE

## 2024-10-07 ENCOUNTER — Encounter (HOSPITAL_COMMUNITY): Admission: RE | Payer: Self-pay | Source: Ambulatory Visit

## 2024-10-07 ENCOUNTER — Encounter (HOSPITAL_COMMUNITY): Payer: Self-pay | Admitting: Orthopedic Surgery

## 2024-10-07 ENCOUNTER — Observation Stay (HOSPITAL_COMMUNITY)
Admission: RE | Admit: 2024-10-07 | Discharge: 2024-10-08 | Disposition: A | Discharge status: Home / Self Care | Source: Ambulatory Visit | Attending: Orthopedic Surgery | Admitting: Orthopedic Surgery

## 2024-10-07 ENCOUNTER — Ambulatory Visit (HOSPITAL_COMMUNITY): Admitting: Anesthesiology

## 2024-10-07 ENCOUNTER — Other Ambulatory Visit: Payer: Self-pay

## 2024-10-07 DIAGNOSIS — M1711 Unilateral primary osteoarthritis, right knee: Principal | ICD-10-CM | POA: Insufficient documentation

## 2024-10-07 DIAGNOSIS — Z96651 Presence of right artificial knee joint: Principal | ICD-10-CM

## 2024-10-07 DIAGNOSIS — E785 Hyperlipidemia, unspecified: Secondary | ICD-10-CM | POA: Insufficient documentation

## 2024-10-07 DIAGNOSIS — Z79899 Other long term (current) drug therapy: Secondary | ICD-10-CM | POA: Insufficient documentation

## 2024-10-07 MED ORDER — 0.9 % SODIUM CHLORIDE (POUR BTL) OPTIME
TOPICAL | Status: DC | PRN
  Administered 2024-10-07: 1000 mL

## 2024-10-07 MED ORDER — OXYCODONE HCL 5 MG PO TABS: 5.0000 mg | ORAL_TABLET | Freq: Once | ORAL | Status: DC | PRN

## 2024-10-07 MED ORDER — ATORVASTATIN CALCIUM 20 MG PO TABS
20.0000 mg | ORAL_TABLET | ORAL | Status: DC
  Administered 2024-10-08: 20 mg via ORAL
  Filled 2024-10-07: qty 1

## 2024-10-07 MED ORDER — FENTANYL CITRATE (PF) 50 MCG/ML IJ SOSY
100.0000 ug | PREFILLED_SYRINGE | INTRAMUSCULAR | Status: DC
  Administered 2024-10-07: 50 ug via INTRAVENOUS
  Filled 2024-10-07: qty 2

## 2024-10-07 MED ORDER — SODIUM CHLORIDE (PF) 0.9 % IJ SOLN
INTRAMUSCULAR | Status: AC
  Filled 2024-10-07: qty 30

## 2024-10-07 MED ORDER — POVIDONE-IODINE 10 % EX SWAB: 2.0000 | Freq: Once | CUTANEOUS | Status: DC

## 2024-10-07 MED ORDER — ROPIVACAINE HCL 5 MG/ML IJ SOLN
INTRAMUSCULAR | Status: DC | PRN
  Administered 2024-10-07: 25 mL via PERINEURAL

## 2024-10-07 MED ORDER — PROPOFOL 10 MG/ML IV BOLUS
INTRAVENOUS | Status: AC
  Filled 2024-10-07: qty 20

## 2024-10-07 MED ORDER — ONDANSETRON HCL 4 MG/2ML IJ SOLN: 4.0000 mg | Freq: Four times a day (QID) | INTRAMUSCULAR | Status: DC | PRN

## 2024-10-07 MED ORDER — TRANEXAMIC ACID-NACL 1000-0.7 MG/100ML-% IV SOLN
1000.0000 mg | INTRAVENOUS | Status: AC
  Administered 2024-10-07: 1000 mg via INTRAVENOUS
  Filled 2024-10-07: qty 100

## 2024-10-07 MED ORDER — DEXAMETHASONE SOD PHOSPHATE PF 10 MG/ML IJ SOLN
INTRAMUSCULAR | Status: DC | PRN
  Administered 2024-10-07: 5 mg via INTRAVENOUS

## 2024-10-07 MED ORDER — PROPOFOL 1000 MG/100ML IV EMUL
INTRAVENOUS | Status: AC
  Filled 2024-10-07: qty 100

## 2024-10-07 MED ORDER — FENTANYL CITRATE (PF) 50 MCG/ML IJ SOSY: 25.0000 ug | PREFILLED_SYRINGE | INTRAMUSCULAR | Status: DC | PRN

## 2024-10-07 MED ORDER — DEXAMETHASONE SOD PHOSPHATE PF 10 MG/ML IJ SOLN: 8.0000 mg | Freq: Once | INTRAMUSCULAR | Status: DC

## 2024-10-07 MED ORDER — CEFAZOLIN SODIUM-DEXTROSE 2-4 GM/100ML-% IV SOLN
2.0000 g | Freq: Four times a day (QID) | INTRAVENOUS | Status: AC
  Administered 2024-10-07 (×2): 2 g via INTRAVENOUS
  Filled 2024-10-07 (×2): qty 100

## 2024-10-07 MED ORDER — PHENYLEPHRINE HCL-NACL 20-0.9 MG/250ML-% IV SOLN
INTRAVENOUS | Status: DC | PRN
  Administered 2024-10-07: 50 ug/min via INTRAVENOUS

## 2024-10-07 MED ORDER — METOCLOPRAMIDE HCL 5 MG PO TABS: 5.0000 mg | ORAL_TABLET | Freq: Three times a day (TID) | ORAL | Status: DC | PRN

## 2024-10-07 MED ORDER — DIPHENHYDRAMINE HCL 12.5 MG/5ML PO ELIX: 12.5000 mg | ORAL_SOLUTION | ORAL | Status: DC | PRN

## 2024-10-07 MED ORDER — SODIUM CHLORIDE 0.9 % IV SOLN: INTRAVENOUS | Status: DC

## 2024-10-07 MED ORDER — ALUM & MAG HYDROXIDE-SIMETH 200-200-20 MG/5ML PO SUSP: 30.0000 mL | ORAL | Status: DC | PRN

## 2024-10-07 MED ORDER — MIDAZOLAM HCL (PF) 2 MG/2ML IJ SOLN: 2.0000 mg | INTRAMUSCULAR | Status: DC

## 2024-10-07 MED ORDER — METOCLOPRAMIDE HCL 5 MG/ML IJ SOLN: 5.0000 mg | Freq: Three times a day (TID) | INTRAMUSCULAR | Status: DC | PRN

## 2024-10-07 MED ORDER — ONDANSETRON HCL 4 MG/2ML IJ SOLN
INTRAMUSCULAR | Status: AC
  Filled 2024-10-07: qty 2

## 2024-10-07 MED ORDER — METHOCARBAMOL 500 MG PO TABS
500.0000 mg | ORAL_TABLET | Freq: Four times a day (QID) | ORAL | Status: DC | PRN
  Administered 2024-10-08: 500 mg via ORAL
  Filled 2024-10-07: qty 1

## 2024-10-07 MED ORDER — SODIUM CHLORIDE (PF) 0.9 % IJ SOLN
INTRAMUSCULAR | Status: DC | PRN
  Administered 2024-10-07: 61 mL

## 2024-10-07 MED ORDER — PHENOL 1.4 % MT LIQD: 1.0000 | OROMUCOSAL | Status: DC | PRN

## 2024-10-07 MED ORDER — OXYCODONE HCL 5 MG PO TABS: 5.0000 mg | ORAL_TABLET | ORAL | Status: DC | PRN

## 2024-10-07 MED ORDER — MENTHOL 3 MG MT LOZG: 1.0000 | LOZENGE | OROMUCOSAL | Status: DC | PRN

## 2024-10-07 MED ORDER — CHLORHEXIDINE GLUCONATE 0.12 % MT SOLN
15.0000 mL | Freq: Once | OROMUCOSAL | Status: AC
  Administered 2024-10-07: 15 mL via OROMUCOSAL

## 2024-10-07 MED ORDER — PROPOFOL 10 MG/ML IV BOLUS
INTRAVENOUS | Status: DC | PRN
  Administered 2024-10-07 (×3): 10 mg via INTRAVENOUS
  Administered 2024-10-07 (×2): 20 mg via INTRAVENOUS

## 2024-10-07 MED ORDER — TRANEXAMIC ACID-NACL 1000-0.7 MG/100ML-% IV SOLN
1000.0000 mg | Freq: Once | INTRAVENOUS | Status: AC
  Administered 2024-10-07: 1000 mg via INTRAVENOUS
  Filled 2024-10-07: qty 100

## 2024-10-07 MED ORDER — CEFAZOLIN SODIUM-DEXTROSE 2-4 GM/100ML-% IV SOLN
2.0000 g | INTRAVENOUS | Status: AC
  Administered 2024-10-07: 2 g via INTRAVENOUS
  Filled 2024-10-07: qty 100

## 2024-10-07 MED ORDER — ACETAMINOPHEN 500 MG PO TABS
1000.0000 mg | ORAL_TABLET | Freq: Four times a day (QID) | ORAL | Status: AC
  Administered 2024-10-07 – 2024-10-08 (×4): 1000 mg via ORAL
  Filled 2024-10-07 (×4): qty 2

## 2024-10-07 MED ORDER — ONDANSETRON HCL 4 MG/2ML IJ SOLN
4.0000 mg | Freq: Four times a day (QID) | INTRAMUSCULAR | Status: DC | PRN
  Administered 2024-10-08: 4 mg via INTRAVENOUS
  Filled 2024-10-07: qty 2

## 2024-10-07 MED ORDER — ONDANSETRON HCL 4 MG/2ML IJ SOLN
INTRAMUSCULAR | Status: DC | PRN
  Administered 2024-10-07: 4 mg via INTRAVENOUS

## 2024-10-07 MED ORDER — ACETAMINOPHEN 325 MG PO TABS: 325.0000 mg | ORAL_TABLET | Freq: Four times a day (QID) | ORAL | Status: DC | PRN

## 2024-10-07 MED ORDER — BISACODYL 10 MG RE SUPP: 10.0000 mg | Freq: Every day | RECTAL | Status: DC | PRN

## 2024-10-07 MED ORDER — DEXAMETHASONE SOD PHOSPHATE PF 10 MG/ML IJ SOLN
10.0000 mg | Freq: Once | INTRAMUSCULAR | Status: AC
  Administered 2024-10-08: 10 mg via INTRAVENOUS
  Filled 2024-10-07: qty 1

## 2024-10-07 MED ORDER — POLYETHYLENE GLYCOL 3350 17 G PO PACK
17.0000 g | PACK | Freq: Two times a day (BID) | ORAL | Status: DC
  Administered 2024-10-07 – 2024-10-08 (×2): 17 g via ORAL
  Filled 2024-10-07 (×2): qty 1

## 2024-10-07 MED ORDER — STERILE WATER FOR IRRIGATION IR SOLN
Status: DC | PRN
  Administered 2024-10-07: 2000 mL

## 2024-10-07 MED ORDER — METHOCARBAMOL 1000 MG/10ML IJ SOLN: 500.0000 mg | Freq: Four times a day (QID) | INTRAMUSCULAR | Status: DC | PRN

## 2024-10-07 MED ORDER — SODIUM CHLORIDE 0.9 % IR SOLN
Status: DC | PRN
  Administered 2024-10-07: 1000 mL

## 2024-10-07 MED ORDER — PROPOFOL 500 MG/50ML IV EMUL
INTRAVENOUS | Status: DC | PRN
  Administered 2024-10-07: 50 ug/kg/min via INTRAVENOUS

## 2024-10-07 MED ORDER — TRAMADOL HCL 50 MG PO TABS: 50.0000 mg | ORAL_TABLET | Freq: Four times a day (QID) | ORAL | Status: DC | PRN

## 2024-10-07 MED ORDER — SENNA 8.6 MG PO TABS
2.0000 | ORAL_TABLET | Freq: Every day | ORAL | Status: DC
  Administered 2024-10-07: 17.2 mg via ORAL
  Filled 2024-10-07: qty 2

## 2024-10-07 MED ORDER — ASPIRIN 81 MG PO CHEW
81.0000 mg | CHEWABLE_TABLET | Freq: Two times a day (BID) | ORAL | Status: DC
  Administered 2024-10-07 – 2024-10-08 (×2): 81 mg via ORAL
  Filled 2024-10-07 (×2): qty 1

## 2024-10-07 MED ORDER — DEXAMETHASONE SOD PHOSPHATE PF 10 MG/ML IJ SOLN
INTRAMUSCULAR | Status: AC
  Filled 2024-10-07: qty 1

## 2024-10-07 MED ORDER — PANTOPRAZOLE SODIUM 20 MG PO TBEC
20.0000 mg | DELAYED_RELEASE_TABLET | Freq: Every day | ORAL | Status: DC
  Administered 2024-10-08: 20 mg via ORAL
  Filled 2024-10-07: qty 1

## 2024-10-07 MED ORDER — MEPIVACAINE HCL (PF) 2 % IJ SOLN
INTRAMUSCULAR | Status: AC
  Filled 2024-10-07: qty 20

## 2024-10-07 MED ORDER — KETOROLAC TROMETHAMINE 30 MG/ML IJ SOLN
INTRAMUSCULAR | Status: AC
  Filled 2024-10-07: qty 1

## 2024-10-07 MED ORDER — HYDROMORPHONE HCL 1 MG/ML IJ SOLN: 0.5000 mg | INTRAMUSCULAR | Status: DC | PRN

## 2024-10-07 MED ORDER — ORAL CARE MOUTH RINSE: 15.0000 mL | OROMUCOSAL | Status: DC | PRN

## 2024-10-07 MED ORDER — LACTATED RINGERS IV SOLN: INTRAVENOUS | Status: DC

## 2024-10-07 MED ORDER — BUPIVACAINE-EPINEPHRINE (PF) 0.25% -1:200000 IJ SOLN
INTRAMUSCULAR | Status: AC
  Filled 2024-10-07: qty 30

## 2024-10-07 MED ORDER — ONDANSETRON HCL 4 MG PO TABS: 4.0000 mg | ORAL_TABLET | Freq: Four times a day (QID) | ORAL | Status: DC | PRN

## 2024-10-07 MED ORDER — OXYCODONE HCL 5 MG/5ML PO SOLN: 5.0000 mg | Freq: Once | ORAL | Status: DC | PRN

## 2024-10-07 MED ORDER — MEPIVACAINE HCL (PF) 2 % IJ SOLN
INTRAMUSCULAR | Status: DC | PRN
  Administered 2024-10-07: 2.5 mL via INTRATHECAL

## 2024-10-07 MED ORDER — ORAL CARE MOUTH RINSE: 15.0000 mL | Freq: Once | OROMUCOSAL | Status: AC

## 2024-10-07 NOTE — H&P (Signed)
 TOTAL KNEE ADMISSION H&P  Patient is being admitted for right total knee arthroplasty.  Therapy Plans: outpatient therapy at [facility] Disposition: Home with husband Planned DVT Prophylaxis: aspirin  81mg  BID DME needed: walker PCP: Dr. Tisovec (saw & had verbal clearance, will resend form) TXA: IV Allergies: NKDA Anesthesia Concerns: none BMI: 23 Last HgbA1c: Not diabetic   Other: - staying overnight - ICE MACHINE at hospital - No hx of VTE or cancer - celebrex , tramadol /oxycodone , robaxin , tylenol  - son in law is a writer in [place]    Subjective:  Chief Complaint: right knee pain.  HPI: Jessica Osborn, 79 y.o. female has a history of pain and functional disability in the right knee due to osteoarthritis and has failed non-surgical conservative treatments for greater than 12 weeks to include NSAID's and/or analgesics, corticosteriod injections, and activity modification. Onset of symptoms was gradual, starting 2 years ago with gradually worsening course since that time. The patient noted no past surgery on the right  knee.  Patient currently rates pain in the right knee at 8 out of 10 with activity. Patient has worsening of pain with activity and weight bearing and pain that interferes with activities of daily living. Patient has evidence of joint space narrowing by imaging studies. There is no active infection.  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   Diverticulosis of colon without hemorrhage 02/26/2013   Pancreatic cyst 02/26/2013    Past Medical History:  Diagnosis Date   Allergy    Arthritis    neck,back   Bradycardia    Cardiomegaly    Chest pain    Constipation    history of--miralax  PRN only    Diverticulosis    Dyspnea    Dyspnea on exertion    GERD (gastroesophageal reflux disease)    Hyperlipidemia    on meds   Jaw pain    Orthopnea    Osteopenia    Pancreatic  cyst    Pancreatic cyst    PVC (premature ventricular contraction)    SOB (shortness of breath)    Vitamin D deficiency     Past Surgical History:  Procedure Laterality Date   BREAST BIOPSY     Right-Benign   COLONOSCOPY  12-30-2004   tics   EUS  11/09/2011   Procedure: UPPER ENDOSCOPIC ULTRASOUND (EUS) LINEAR;  Surgeon: Toribio Cedar, MD;  Location: [facility] ENDOSCOPY;  Service: Endoscopy;  Laterality: N/A;  radial linear    FINE NEEDLE ASPIRATION  11/09/2011   Procedure: FINE NEEDLE ASPIRATION (FNA) LINEAR;  Surgeon: Toribio Cedar, MD;  Location: [facility] ENDOSCOPY;  Service: Endoscopy;;   THYROID  LOBECTOMY Right 01/11/2024   Procedure: RIGHT LOBECTOMY, THYROID ;  Surgeon: Eletha Boas, MD;  Location: [facility] ORS;  Service: General;  Laterality: Right;  RIGHT THYROID  LOBECTOMY   TONSILLECTOMY      Prior to Admission medications  Medication Sig Start Date End Date Taking? Authorizing Provider  acetaminophen  (TYLENOL ) 500 MG tablet Take 500 mg by mouth every 6 (six) hours as needed for moderate pain (pain score 4-6).   Yes [provider]  atorvastatin  (LIPITOR) 20 MG tablet Take 20 mg by mouth every other day. In the morning   Yes [provider]  bifidobacterium infantis (ALIGN) capsule Take 1 capsule by mouth daily. 02/26/13  Yes Esterwood, Amy S, PA-C  BIOTIN PO Take 1,000 mcg by mouth daily.   Yes [provider]  Calcium  Carbonate  Antacid (TUMS PO) Take 1 tablet by mouth daily as needed (heartburn).   Yes [provider]  Cholecalciferol (VITAMIN D3) 50 MCG (2000 UT) TABS Take 2,000 Units by mouth in the morning.   Yes [provider]  fluticasone (FLONASE) 50 MCG/ACT nasal spray Place 2 sprays into both nostrils daily as needed for allergies. 02/23/20  Yes [provider]  ibandronate (BONIVA) 150 MG tablet Take 150 mg by mouth every 30 (thirty) days. 04/12/23  Yes [provider]  ibuprofen (ADVIL) 200 MG tablet Take 200 mg by mouth every 8  (eight) hours as needed for moderate pain (pain score 4-6).   Yes [provider]  Magnesium 250 MG TABS Take 250 mg by mouth in the morning.   Yes [provider]  Multiple Minerals-Vitamins (CITRACAL MAXIMUM PLUS PO) Take 2 tablets by mouth daily.   Yes [provider]  pantoprazole  (PROTONIX ) 20 MG tablet Take 20 mg by mouth in the morning. 07/26/22  Yes [provider]  Polyethyl Glyc-Propyl Glyc PF (SYSTANE ULTRA PF) 0.4-0.3 % SOLN Place 1-2 drops into both eyes 3 (three) times daily as needed (dry/irritated eyes.).   Yes [provider]  Polyethylene Glycol 3350  (MIRALAX  PO) Take 17 g by mouth daily as needed (constipation).   Yes [provider]  vitamin E 400 UNIT capsule Take 400 Units by mouth in the morning.   Yes [provider]  celecoxib  (CELEBREX ) 100 MG capsule Take 100 mg by mouth 2 (two) times daily as needed (pain). Patient not taking: Reported on 09/24/2024    [provider]  estradiol (ESTRACE) 0.01 % CREA vaginal cream Place 1 Applicatorful vaginally 2 (two) times a week.    [provider]  fexofenadine (ALLEGRA) 180 MG tablet Take 180 mg by mouth daily. Patient not taking: Reported on 09/24/2024    [provider]  methocarbamol  (ROBAXIN ) 500 MG tablet Take 500 mg by mouth 2 (two) times daily as needed for muscle spasms. Patient not taking: Reported on 09/24/2024    [provider]  traMADol  (ULTRAM ) 50 MG tablet Take 50 mg by mouth every 6 (six) hours as needed for moderate pain (pain score 4-6) or severe pain (pain score 7-10). Patient not taking: Reported on 09/24/2024    [provider]    Allergies[1]  Social History   Socioeconomic History   Marital status: Married    Spouse name: Not on file   Number of children: 2   Years of education: Not on file   Highest education level: Not on file  Occupational History   Occupation: Homemaker  Tobacco Use    Smoking status: Never    Passive exposure: Past   Smokeless tobacco: Never  Vaping Use   Vaping status: Never Used  Substance and Sexual Activity   Alcohol  use: Yes    Alcohol /week: 1.0 standard drink of alcohol     Types: 1 Glasses of wine per week    Comment: socially   Drug use: No   Sexual activity: Not on file  Other Topics Concern   Not on file  Social History Narrative   Occ caffeine    Social Drivers of Health   Tobacco Use: Low Risk (09/30/2024)   Patient History    Smoking Tobacco Use: Never    Smokeless Tobacco Use: Never    Passive Exposure: Past  Financial Resource Strain: Not on file  Food Insecurity: No Food Insecurity (01/12/2024)   Hunger Vital Sign  Worried About Programme Researcher, Broadcasting/film/video in the Last Year: Never true    Ran Out of Food in the Last Year: Never true  Transportation Needs: No Transportation Needs (01/12/2024)   PRAPARE - Administrator, Civil Service (Medical): No    Lack of Transportation (Non-Medical): No  Physical Activity: Not on file  Stress: Not on file  Social Connections: Unknown (01/11/2024)   Social Connection and Isolation Panel    Frequency of Communication with Friends and Family: Patient declined    Frequency of Social Gatherings with Friends and Family: Patient declined    Attends Religious Services: Patient declined    Database Administrator or Organizations: Patient declined    Attends Banker Meetings: Patient declined    Marital Status: Married  Catering Manager Violence: Not At Risk (01/12/2024)   Humiliation, Afraid, Rape, and Kick questionnaire    Fear of Current or Ex-Partner: No    Emotionally Abused: No    Physically Abused: No    Sexually Abused: No  Depression (PHQ2-9): Not on file  Alcohol  Screen: Not on file  Housing: Low Risk (01/12/2024)   Housing Stability Vital Sign    Unable to Pay for Housing in the Last Year: No    Number of Times Moved in the Last Year: 0    Homeless in the Last Year:  No  Utilities: Not At Risk (01/12/2024)   AHC Utilities    Threatened with loss of utilities: No  Health Literacy: Not on file    Tobacco Use: Low Risk (09/30/2024)   Patient History    Smoking Tobacco Use: Never    Smokeless Tobacco Use: Never    Passive Exposure: Past   Social History   Substance and Sexual Activity  Alcohol  Use Yes   Alcohol /week: 1.0 standard drink of alcohol    Types: 1 Glasses of wine per week   Comment: socially    Family History  Problem Relation Age of Onset   Emphysema Father    COPD Father    Emphysema Mother    Osteoporosis Mother    Atrial fibrillation Mother    Throat cancer Mother    Cancer Mother        Neck    COPD Mother    Macular degeneration Paternal Grandmother    Colon cancer Neg Hx    Rectal cancer Neg Hx    Stomach cancer Neg Hx    Esophageal cancer Neg Hx    Colon polyps Neg Hx     Review Of Systems: Constitutional: Constitutional: no fever, chills, night sweats, or significant weight loss. Cardiovascular: Cardiovascular: no palpitations or chest pain. Respiratory: Respiratory: no cough or shortness of breath and No COPD. Gastrointestinal: Gastrointestinal: no vomiting or nausea. Musculoskeletal: Musculoskeletal: Joint Pain and swelling in Joints. Neurologic: Neurologic: no numbness, tingling, or difficulty with balance.  Objective:  Physical Exam: Right knee exam: No palpable effusion, warmth erythema Slight flexion contracture noted Flexion to 120 degrees Tenderness over the medial and anterior aspect knee Stable medial and lateral collateral ligaments  Vital signs in last 24 hours:    Imaging Review Plain radiographs demonstrate severe degenerative joint disease of the right knee.  The bone quality appears to be adequate for age and reported activity level.  Assessment/Plan:  End stage arthritis, right knee   The patient history, physical examination, clinical judgment of the provider and imaging studies  are consistent with end stage degenerative joint disease of the right knee and total  knee arthroplasty is deemed medically necessary. The treatment options including medical management, injection therapy arthroscopy and arthroplasty were discussed at length. The risks and benefits of total knee arthroplasty were presented and reviewed. The risks due to aseptic loosening, infection, stiffness, patella tracking problems, thromboembolic complications and other imponderables were discussed. The patient acknowledged the explanation, agreed to proceed with the plan and consent was signed. Patient is being admitted for inpatient treatment for surgery, pain control, PT, OT, prophylactic antibiotics, VTE prophylaxis, progressive ambulation and ADLs and discharge planning. The patient is planning to be discharged home.   Patient's anticipated LOS is less than 2 midnights, meeting these requirements: - Younger than 79 - Lives within 1 hour of care - Has a competent adult at home to recover with post-op recover - NO history of  - Chronic pain requiring opiods  - Diabetes  - Coronary Artery Disease  - Heart failure  - Heart attack  - Stroke  - DVT/VTE  - Cardiac arrhythmia  - Respiratory Failure/COPD  - Renal failure  - Anemia  - Advanced Liver disease    Rosina Calin, PA-C Orthopedic Surgery [facility] [place] [phone]      [1] No Known Allergies

## 2024-10-07 NOTE — Anesthesia Procedure Notes (Signed)
 Spinal  Patient location during procedure: OR Start time: 10/07/2024 11:30 AM End time: 10/07/2024 11:33 AM Reason for block: surgical anesthesia  Staffing Performed: anesthesiologist  Authorized by: Maryclare Cornet, MD   Performed by: Maryclare Cornet, MD  Preanesthetic Checklist Completed: patient identified, IV checked, risks and benefits discussed, surgical consent, monitors and equipment checked, pre-op evaluation and timeout performed Spinal Block Patient position: sitting Prep: DuraPrep Patient monitoring: cardiac monitor, continuous pulse ox and blood pressure Approach: midline Location: L3-4 Injection technique: single-shot Needle Needle type: Pencan  Needle gauge: 24 G Needle length: 9 cm Assessment Sensory level: T10 Events: CSF return  Additional Notes Functioning IV was confirmed and monitors were applied. Sterile prep and drape, including hand hygiene and sterile gloves were used. The patient was positioned and the spine was prepped. The skin was anesthetized with lidocaine .  Free flow of clear CSF was obtained prior to injecting local anesthetic into the CSF.  The spinal needle aspirated freely following injection.  The needle was carefully withdrawn.  The patient tolerated the procedure well.

## 2024-10-07 NOTE — Interval H&P Note (Signed)
 History and Physical Interval Note:  10/07/2024 9:57 AM  Jessica Osborn  has presented today for surgery, with the diagnosis of Right knee osteoarthritis.  The various methods of treatment have been discussed with the patient and family. After consideration of risks, benefits and other options for treatment, the patient has consented to  Procedures: ARTHROPLASTY, KNEE, TOTAL (Right) as a surgical intervention.  The patient's history has been reviewed, patient examined, no change in status, stable for surgery.  I have reviewed the patient's chart and labs.  Questions were answered to the patient's satisfaction.     Jessica Osborn

## 2024-10-07 NOTE — Op Note (Signed)
 " NAME:  Jessica Osborn                      MEDICAL RECORD NO.:  [id number]                             FACILITY:  [facility]      PHYSICIAN:  Donnice BIRCH. Ernie, M.D.  DATE OF BIRTH:  [birth year]      DATE OF PROCEDURE:  10/07/2024                                     OPERATIVE REPORT         PREOPERATIVE DIAGNOSIS:  right knee osteoarthritis.      POSTOPERATIVE DIAGNOSIS:  right knee osteoarthritis.      FINDINGS:  The patient was noted to have complete loss of cartilage and   bone-on-bone arthritis with associated osteophytes in the medial and patellofemoral compartments of   the knee with a significant synovitis and associated effusion.  The patient had failed months of conservative treatment including medications, injection therapy, activity modification.     PROCEDURE:  right total knee replacement.      COMPONENTS USED:  DePuy Attune fixed bearing cruciate retaining medial stabilized knee   system, a size 3 femur, 3 tibia, size 5 mm CR MS AOX insert, and 35 anatomic patellar   button.      SURGEON:  Donnice BIRCH. Ernie, M.D.      ASSISTANT:  Rosina Calin, PA-C.      ANESTHESIA:  Regional and Spinal.      SPECIMENS:  None.      COMPLICATION:  None.      DRAINS:  None.  EBL: 100cc      TOURNIQUET TIME:  no tourniquet was used     The patient was stable to the recovery room.      INDICATION FOR PROCEDURE:  Jessica Osborn is a 79 y.o. female patient of   mine.  The patient had been seen, evaluated, and treated for months conservatively in the   office with medication, activity modification, and injections.  The patient had   radiographic changes of bone-on-bone arthritis with endplate sclerosis and osteophytes noted.  Based on the radiographic changes and failed conservative measures, the patient   decided to proceed with definitive treatment, total knee replacement.  Risks of infection, DVT, component failure, need for revision surgery, neurovascular injury were reviewed in the  office setting.  The postop course was reviewed stressing the efforts to maximize post-operative satisfaction and function.  Consent was obtained for benefit of pain   relief.      PROCEDURE IN DETAIL:  The patient was brought to the operative theater.   Once adequate anesthesia, preoperative antibiotics, 2 gm of Ancef ,1 gm of Tranexamic Acid , and 10 mg of Decadron  administered, the patient was positioned supine with bony prominences padded and protected.  The right lower extremity was prepped and draped in sterile fashion.  A time-   out was performed identifying the patient, planned procedure, and the appropriate extremity.      The right lower extremity was placed in the [facility] leg holder.  A midline incision was   made followed by median parapatellar arthrotomy.  Following initial   exposure, attention was first directed to the patella.  Precut   measurement was  noted to be 21 mm.  I resected down to 13 mm and used a   35 anatomic patellar button to restore patellar height as well as cover the cut surface.  A limited lateral partial facetectomy was performed.     The lug holes were drilled and a metal shim was placed to protect the   patella from retractors and saw blade during the procedure.      At this point, attention was now directed to the femur.  The femoral   canal was opened with a drill, irrigated to try to prevent fat emboli.  An   intramedullary rod was passed at 3 degrees valgus, 8 mm of bone was   resected off the distal femur.  Following this resection, the tibia was   subluxated anteriorly.  Using the extramedullary guide, 2 mm of bone was resected off   the proximal medial tibia.  We confirmed the gap would be   stable medially and laterally with a size 5 spacer block as well as confirmed that the tibial cut was perpendicular in the coronal plane, checking with an alignment rod.      Once this was done, I sized the femur to be a size 3 in the anterior-   posterior  dimension, chose a standard component based on medial and   lateral dimension.  The size 3 rotation block was then pinned in   position anterior referenced using the C-clamp to set rotation.  The   anterior, posterior, and  chamfer cuts were made without difficulty nor   notching making certain that I was along the anterior cortex to help   with flexion gap stability.      The final femoral shim cut was made off the lateral aspect of distal femur.      At this point, the tibia was sized to be a size 3.  The size 3 tray was   then pinned in position through the medial third of the tubercle,   drilled, and keel punched.  Trial reduction was now carried with a 3 femur,  3 tibia, a size 5 mm CR insert, and the 35 anatomic patella botton.  The knee was brought to full extension with good flexion stability with the patella   tracking through the trochlea without application of pressure.  Given   all these findings the trial components removed.  Final components were   opened and cement was mixed.  The knee was irrigated with normal saline solution and pulse lavage.  The posterior synovial capsule was then injected with 30 cc of 0.25% Marcaine  with epinephrine , 1 cc of Toradol  and 30 cc of NS for a total of 61 cc.     Final implants were then cemented onto cleaned and dried cut surfaces of bone with the knee brought to extension with a size 5 mm CR trial insert.      Once the cement had fully cured, excess cement was removed   throughout the knee.  I confirmed that I was satisfied with the range of   motion and stability, and the final size 5 mm CR MS AOX insert was chosen.  It was   placed into the knee.      At this point in the case there was no significant   hemostasis was required.  The extensor mechanism was then reapproximated using #1 Vicryl and #1 Stratafix sutures with the knee   in flexion.  The   remaining wound was closed  with 2-0 Vicryl and running 4-0 Monocryl.   The knee was  cleaned, dried, dressed sterilely using Dermabond and   Aquacel dressing.  The patient was then   brought to recovery room in stable condition, tolerating the procedure   well.   Please note that PA Patti was present for the entirety of the case, and was utilized for pre-operative positioning, peri-operative retractor management, general facilitation of the procedure and for primary wound closure at the end of the case.              Donnice CORDOBA Ernie, M.D.    10/07/2024 12:34 PM "

## 2024-10-07 NOTE — Anesthesia Procedure Notes (Signed)
 Date/Time: 10/07/2024 11:22 AM  Performed by: Therisa Doyal CROME, CRNAOxygen Delivery Method: Simple face mask

## 2024-10-07 NOTE — Evaluation (Signed)
 Physical Therapy Evaluation Patient Details Name: Jessica Osborn MRN: [id number] DOB: [birth year] Today's Date: 10/07/2024  History of Present Illness  79 yo female presents to therapy s/p R TKA on 10/07/2024 due to failure of conservative measures. Pt PMH includes but is not limited to: thyroid  nodule s/p R lobectomy, diverticulosis, pancreatic cyst, arthritis, bradycardia, angina, DOE, GERD, HLD, and PVCs.  Clinical Impression    Jessica Osborn is a 79 y.o. female POD 0 s/p R TKA. Patient reports IND with mobility at baseline. Patient is now limited by functional impairments (see PT problem list below) and requires CGA for bed mobility and CGA for transfers. Patient was able to ambulate 20 feet with RW and CGA level of assist. Patient instructed in exercise to facilitate ROM and circulation to manage edema. Patient will benefit from continued skilled PT interventions to address impairments and progress towards PLOF. Acute PT will follow to progress mobility and stair training in preparation for safe discharge home with family support and OPPT services.       If plan is discharge home, recommend the following: A little help with walking and/or transfers;A little help with bathing/dressing/bathroom;Assistance with cooking/housework;Assist for transportation;Help with stairs or ramp for entrance   Can travel by private vehicle        Equipment Recommendations Rolling walker (2 wheels) (Youth)  Recommendations for Other Services       Functional Status Assessment Patient has had a recent decline in their functional status and demonstrates the ability to make significant improvements in function in a reasonable and predictable amount of time.     Precautions / Restrictions Precautions Precautions: Fall;Knee Restrictions Weight Bearing Restrictions Per Provider Order: No      Mobility  Bed Mobility Overal bed mobility: Needs Assistance Bed Mobility: Supine to Sit     Supine to sit:  Contact guard, HOB elevated     General bed mobility comments: min cues    Transfers Overall transfer level: Needs assistance Equipment used: Rolling walker (2 wheels) Transfers: Sit to/from Stand Sit to Stand: Contact guard assist           General transfer comment: min cues    Ambulation/Gait Ambulation/Gait assistance: Contact guard assist Gait Distance (Feet): 20 Feet Assistive device: Rolling walker (2 wheels) Gait Pattern/deviations: Step-to pattern, Decreased stance time - right, Antalgic, Trunk flexed Gait velocity: decreased     General Gait Details: slight trunk flexion with B UE support at Rw to offload R LE in stance phase, noted lateral sway, min cues for safety, posture and RW management  Stairs            Wheelchair Mobility     Tilt Bed    Modified Rankin (Stroke Patients Only)       Balance Overall balance assessment: Needs assistance Sitting-balance support: Feet supported Sitting balance-Leahy Scale: Good     Standing balance support: Bilateral upper extremity supported, Reliant on assistive device for balance, During functional activity Standing balance-Leahy Scale: Fair Standing balance comment: static standing no UE support                             Pertinent Vitals/Pain Pain Assessment Pain Assessment: 0-10 Pain Score: 2  Pain Location: R LE and Knee Pain Descriptors / Indicators: Aching, Constant, Discomfort, Dull, Grimacing Pain Intervention(s): Monitored during session, Limited activity within patient's tolerance, Premedicated before session, Repositioned, Ice applied    Home Living Family/patient expects to be  discharged to:: Private residence Living Arrangements: Spouse/significant other Available Help at Discharge: Family Type of Home: House Home Access: Level entry     Alternate Level Stairs-Number of Steps: flight Home Layout: Two level;Bed/bath upstairs Home Equipment: None Additional Comments:  iceman machine    Prior Function Prior Level of Function : Independent/Modified Independent;Driving             Mobility Comments: IND no AD for all ADLs, self care tasks and IADLs       Extremity/Trunk Assessment        Lower Extremity Assessment Lower Extremity Assessment: RLE deficits/detail RLE Deficits / Details: ankle DF/PF 5/5;SLR < 10 degree lag RLE Sensation: WNL    Cervical / Trunk Assessment Cervical / Trunk Assessment: Normal  Communication   Communication Communication: No apparent difficulties    Cognition Arousal: Alert Behavior During Therapy: WFL for tasks assessed/performed   PT - Cognitive impairments: No apparent impairments                         Following commands: Intact       Cueing       General Comments      Exercises Total Joint Exercises Ankle Circles/Pumps: AROM, Both, 10 reps   Assessment/Plan    PT Assessment Patient needs continued PT services  PT Problem List Decreased strength;Decreased range of motion;Decreased activity tolerance;Decreased balance;Decreased mobility;Decreased coordination;Pain       PT Treatment Interventions DME instruction;Gait training;Stair training;Functional mobility training;Therapeutic activities;Therapeutic exercise;Balance training;Neuromuscular re-education;Patient/family education;Modalities    PT Goals (Current goals can be found in the Care Plan section)  Acute Rehab PT Goals Patient Stated Goal: to be able to get back to doing yard work, be able to climb steps and take care of the new grandchild in 3 months PT Goal Formulation: With patient Time For Goal Achievement: 10/21/24 Potential to Achieve Goals: Good    Frequency 7X/week     Co-evaluation               AM-PAC PT 6 Clicks Mobility  Outcome Measure Help needed turning from your back to your side while in a flat bed without using bedrails?: None Help needed moving from lying on your back to sitting on  the side of a flat bed without using bedrails?: A Little Help needed moving to and from a bed to a chair (including a wheelchair)?: A Little Help needed standing up from a chair using your arms (e.g., wheelchair or bedside chair)?: A Little Help needed to walk in hospital room?: A Little Help needed climbing 3-5 steps with a railing? : A Lot 6 Click Score: 18    End of Session Equipment Utilized During Treatment: Gait belt Activity Tolerance: No increased pain;Patient tolerated treatment well Patient left: in chair;with call bell/phone within reach;with chair alarm set Nurse Communication: Mobility status PT Visit Diagnosis: Unsteadiness on feet (R26.81);Other abnormalities of gait and mobility (R26.89);Muscle weakness (generalized) (M62.81);Difficulty in walking, not elsewhere classified (R26.2);Pain Pain - Right/Left: Right Pain - part of body: Leg;Knee    Time: 8187-8161 PT Time Calculation (min) (ACUTE ONLY): 26 min   Charges:   PT Evaluation $PT Eval Low Complexity: 1 Low PT Treatments $Therapeutic Activity: 8-22 mins PT General Charges $$ ACUTE PT VISIT: 1 Visit         [place], PT Acute Rehab   [place] VEAR Drone 10/07/2024, 6:58 PM

## 2024-10-07 NOTE — Transfer of Care (Signed)
 Immediate Anesthesia Transfer of Care Note  Patient: Jessica Osborn  Procedure(s) Performed: ARTHROPLASTY, KNEE, TOTAL (Right: Knee)  Patient Location: PACU  Anesthesia Type:Spinal  Level of Consciousness: awake, alert , and oriented  Airway & Oxygen Therapy: Patient Spontanous Breathing and Patient connected to face mask oxygen  Post-op Assessment: Report given to RN and Post -op Vital signs reviewed and stable  Post vital signs: Reviewed and stable  Last Vitals:  Vitals Value Taken Time  BP 134/61 10/07/24 12:56  Temp    Pulse 70 10/07/24 12:58  Resp 14 10/07/24 12:58  SpO2 100 % 10/07/24 12:58  Vitals shown include unfiled device data.  Last Pain:  Vitals:   10/07/24 0841  TempSrc: Oral         Complications: No notable events documented.

## 2024-10-07 NOTE — Plan of Care (Signed)
" °  Problem: Education: Goal: Knowledge of the prescribed therapeutic regimen will improve Outcome: Progressing   Problem: Bowel/Gastric: Goal: Gastrointestinal status for postoperative course will improve Outcome: Progressing   Problem: Cardiac: Goal: Ability to maintain an adequate cardiac output Outcome: Progressing Goal: Will show no evidence of cardiac arrhythmias Outcome: Progressing   Problem: Nutritional: Goal: Will attain and maintain optimal nutritional status Outcome: Progressing   Problem: Neurological: Goal: Will regain or maintain usual level of consciousness Outcome: Progressing   Problem: Clinical Measurements: Goal: Ability to maintain clinical measurements within normal limits Outcome: Progressing Goal: Postoperative complications will be avoided or minimized Outcome: Progressing   Problem: Respiratory: Goal: Will regain and/or maintain adequate ventilation Outcome: Progressing Goal: Respiratory status will improve Outcome: Progressing   Problem: Skin Integrity: Goal: Demonstrates signs of wound healing without infection Outcome: Progressing   Problem: Urinary Elimination: Goal: Will remain free from infection Outcome: Progressing Goal: Ability to achieve and maintain adequate urine output Outcome: Progressing   Problem: Health Behavior/Discharge Planning: Goal: Ability to manage health-related needs will improve Outcome: Progressing   Problem: Clinical Measurements: Goal: Ability to maintain clinical measurements within normal limits will improve Outcome: Progressing Goal: Will remain free from infection Outcome: Progressing Goal: Diagnostic test results will improve Outcome: Progressing Goal: Respiratory complications will improve Outcome: Progressing Goal: Cardiovascular complication will be avoided Outcome: Progressing   Problem: Activity: Goal: Risk for activity intolerance will decrease Outcome: Progressing   Problem:  Nutrition: Goal: Adequate nutrition will be maintained Outcome: Progressing   Problem: Coping: Goal: Level of anxiety will decrease Outcome: Progressing   Problem: Elimination: Goal: Will not experience complications related to bowel motility Outcome: Progressing Goal: Will not experience complications related to urinary retention Outcome: Progressing   Problem: Pain Managment: Goal: General experience of comfort will improve and/or be controlled Outcome: Progressing   Problem: Safety: Goal: Ability to remain free from injury will improve Outcome: Progressing   Problem: Skin Integrity: Goal: Risk for impaired skin integrity will decrease Outcome: Progressing   Problem: Education: Goal: Knowledge of the prescribed therapeutic regimen will improve Outcome: Progressing Goal: Individualized Educational Video(s) Outcome: Progressing   Problem: Activity: Goal: Ability to avoid complications of mobility impairment will improve Outcome: Progressing Goal: Range of joint motion will improve Outcome: Progressing   Problem: Clinical Measurements: Goal: Postoperative complications will be avoided or minimized Outcome: Progressing   Problem: Pain Management: Goal: Pain level will decrease with appropriate interventions Outcome: Progressing   Problem: Skin Integrity: Goal: Will show signs of wound healing Outcome: Progressing   "

## 2024-10-07 NOTE — Discharge Instructions (Signed)

## 2024-10-07 NOTE — Anesthesia Procedure Notes (Signed)
 Anesthesia Regional Block: Adductor canal block   Pre-Anesthetic Checklist: , timeout performed,  Correct Patient, Correct Site, Correct Laterality,  Correct Procedure, Correct Position, site marked,  Risks and benefits discussed,  Surgical consent,  Pre-op evaluation,  At surgeon's request and post-op pain management  Laterality: Right  Prep: chloraprep       Needles:  Injection technique: Single-shot  Needle Type: Echogenic Needle     Needle Length: 9cm  Needle Gauge: 21     Additional Needles:   Narrative:  Start time: 10/07/2024 10:30 AM End time: 10/07/2024 10:35 AM Injection made incrementally with aspirations every 5 mL.  Performed by: Personally  Anesthesiologist: Maryclare Cornet, MD  Additional Notes: Pt tolerated the procedure well.

## 2024-10-07 NOTE — Anesthesia Preprocedure Evaluation (Signed)
"                                    Anesthesia Evaluation  Patient identified by MRN, date of birth, ID band Patient awake    Reviewed: Allergy & Precautions, H&P , NPO status , Patient's Chart, lab work & pertinent test results  Airway Mallampati: II   Neck ROM: full    Dental   Pulmonary neg pulmonary ROS   breath sounds clear to auscultation       Cardiovascular negative cardio ROS  Rhythm:regular Rate:Normal     Neuro/Psych    GI/Hepatic ,GERD  ,,  Endo/Other    Renal/GU      Musculoskeletal  (+) Arthritis ,    Abdominal   Peds  Hematology   Anesthesia Other Findings   Reproductive/Obstetrics                              Anesthesia Physical Anesthesia Plan  ASA: 2  Anesthesia Plan: MAC and Spinal   Post-op Pain Management: Regional block*   Induction: Intravenous  PONV Risk Score and Plan: 2 and Propofol  infusion and Treatment may vary due to age or medical condition  Airway Management Planned: Simple Face Mask  Additional Equipment:   Intra-op Plan:   Post-operative Plan:   Informed Consent: I have reviewed the patients History and Physical, chart, labs and discussed the procedure including the risks, benefits and alternatives for the proposed anesthesia with the patient or authorized representative who has indicated his/her understanding and acceptance.     Dental advisory given  Plan Discussed with: CRNA, Anesthesiologist and Surgeon  Anesthesia Plan Comments:         Anesthesia Quick Evaluation  "

## 2024-10-08 ENCOUNTER — Encounter (HOSPITAL_COMMUNITY): Payer: Self-pay | Admitting: Orthopedic Surgery

## 2024-10-08 DIAGNOSIS — M1711 Unilateral primary osteoarthritis, right knee: Secondary | ICD-10-CM | POA: Diagnosis not present

## 2024-10-08 LAB — BASIC METABOLIC PANEL WITH GFR
Anion gap: 9 (ref 5–15)
BUN: 11 mg/dL (ref 8–23)
CO2: 24 mmol/L (ref 22–32)
Calcium: 8.4 mg/dL — ABNORMAL LOW (ref 8.9–10.3)
Chloride: 105 mmol/L (ref 98–111)
Creatinine, Ser: 0.81 mg/dL (ref 0.44–1.00)
GFR, Estimated: >60 mL/min
Glucose, Bld: 116 mg/dL — ABNORMAL HIGH (ref 70–99)
Potassium: 4.1 mmol/L (ref 3.5–5.1)
Sodium: 137 mmol/L (ref 135–145)

## 2024-10-08 LAB — CBC
HCT: 32.6 % — ABNORMAL LOW (ref 36.0–46.0)
Hemoglobin: 11 g/dL — ABNORMAL LOW (ref 12.0–15.0)
MCH: 30.4 pg (ref 26.0–34.0)
MCHC: 33.7 g/dL (ref 30.0–36.0)
MCV: 90.1 fL (ref 80.0–100.0)
Platelets: 204 10*3/uL (ref 150–400)
RBC: 3.62 MIL/uL — ABNORMAL LOW (ref 3.87–5.11)
RDW: 13 % (ref 11.5–15.5)
WBC: 11.1 10*3/uL — ABNORMAL HIGH (ref 4.0–10.5)
nRBC: 0 % (ref 0.0–0.2)

## 2024-10-08 MED ORDER — TRAMADOL HCL 50 MG PO TABS: 50.0000 mg | ORAL_TABLET | Freq: Four times a day (QID) | ORAL | 0 refills | Status: AC | PRN

## 2024-10-08 MED ORDER — ASPIRIN 81 MG PO CHEW: 81.0000 mg | CHEWABLE_TABLET | Freq: Two times a day (BID) | ORAL | 0 refills | Status: AC

## 2024-10-08 MED ORDER — SENNA 8.6 MG PO TABS: 2.0000 | ORAL_TABLET | Freq: Every day | ORAL | 0 refills | Status: AC

## 2024-10-08 MED ORDER — CELECOXIB 100 MG PO CAPS: 100.0000 mg | ORAL_CAPSULE | Freq: Two times a day (BID) | ORAL | 0 refills | Status: AC | PRN

## 2024-10-08 MED ORDER — METHOCARBAMOL 500 MG PO TABS: 500.0000 mg | ORAL_TABLET | Freq: Four times a day (QID) | ORAL | 2 refills | Status: AC | PRN

## 2024-10-08 NOTE — Progress Notes (Signed)
 Physical Therapy Treatment Patient Details Name: Jessica Osborn MRN: [id number] DOB: [birth year] Today's Date: 10/08/2024   History of Present Illness 79 yo female presents to therapy s/p R TKA on 10/07/2024 due to failure of conservative measures. Pt PMH includes but is not limited to: thyroid  nodule s/p R lobectomy, diverticulosis, pancreatic cyst, arthritis, bradycardia, angina, DOE, GERD, HLD, and PVCs.    PT Comments  Pt limited by nausea at this time, RN gave meds during session. Reviewed TKA HEP and pt tol well. Will see again in pm to progress mobility. Pt is motivated to d/c today     If plan is discharge home, recommend the following: A little help with walking and/or transfers;A little help with bathing/dressing/bathroom;Assistance with cooking/housework;Assist for transportation;Help with stairs or ramp for entrance   Can travel by private vehicle        Equipment Recommendations  Rolling walker (2 wheels) (You[employer])    Recommendations for Other Services       Precautions / Restrictions Precautions Precautions: Fall;Knee Recall of Precautions/Restrictions: Intact Restrictions Weight Bearing Restrictions Per Provider Order: No     Mobility  Bed Mobility                    Transfers                        Ambulation/Gait               General Gait Details: deferred amb at this time d/t nausea   Stairs             Wheelchair Mobility     Tilt Bed    Modified Rankin (Stroke Patients Only)       Balance                                            Communication Communication Communication: No apparent difficulties  Cognition Arousal: Alert Behavior During Therapy: WFL for tasks assessed/performed   PT - Cognitive impairments: No apparent impairments                         Following commands: Intact      Cueing    Exercises Total Joint Exercises Ankle Circles/Pumps: AROM, Both, 10  reps Quad Sets: AROM, Both, 10 reps Heel Slides: AAROM, Right, 10 reps, AROM Hip ABduction/ADduction: AROM, Right, 10 reps Straight Leg Raises: AROM, AAROM, Right, 10 reps    General Comments        Pertinent Vitals/Pain Pain Assessment Pain Assessment: 0-10 Pain Score: 3  Pain Location: R LE and Knee Pain Descriptors / Indicators: Aching, Constant, Discomfort, Dull, Grimacing Pain Intervention(s): Limited activity within patient's tolerance, Monitored during session, Premedicated before session, Repositioned, Ice applied    Home Living                          Prior Function            PT Goals (current goals can now be found in the care plan section) Acute Rehab PT Goals Patient Stated Goal: to be able to get back to doing yard work, be able to climb steps and take care of the new grandchild in 3 months PT Goal Formulation: With patient Time For Goal Achievement: 10/21/24 Potential to Achieve  Goals: Good Progress towards PT goals: Progressing toward goals    Frequency    7X/week      PT Plan      Co-evaluation              AM-PAC PT 6 Clicks Mobility   Outcome Measure  Help needed turning from your back to your side while in a flat bed without using bedrails?: None Help needed moving from lying on your back to sitting on the side of a flat bed without using bedrails?: A Little Help needed moving to and from a bed to a chair (including a wheelchair)?: A Little Help needed standing up from a chair using your arms (e.g., wheelchair or bedside chair)?: A Little Help needed to walk in hospital room?: A Little Help needed climbing 3-5 steps with a railing? : A Lot 6 Click Score: 18    End of Session Equipment Utilized During Treatment: Gait belt Activity Tolerance: No increased pain;Patient tolerated treatment well Patient left: in chair;with call bell/phone within reach;with chair alarm set;with family/visitor present Nurse Communication:  Mobility status PT Visit Diagnosis: Unsteadiness on feet (R26.81);Other abnormalities of gait and mobility (R26.89);Muscle weakness (generalized) (M62.81);Difficulty in walking, not elsewhere classified (R26.2);Pain Pain - Right/Left: Right Pain - part of body: Leg;Knee     Time: 8962-8940 PT Time Calculation (min) (ACUTE ONLY): 22 min  Charges:    $Therapeutic Exercise: 8-22 mins PT General Charges $$ ACUTE PT VISIT: 1 Visit                     Cyree Chuong, PT  Acute Rehab Dept [facility]) [phone]  10/08/2024    [facility] 10/08/2024, 11:41 AM

## 2024-10-08 NOTE — TOC Transition Note (Signed)
 Transition of Care [facility]) - Discharge Note   Patient Details  Name: Jessica Osborn MRN: [id number] Date of Birth: May 01, 1946  [facility]) CM/SW Contact:  Alfonse JONELLE Rex, RN Phone Number: 10/08/2024, 9:59 AM   Clinical Narrative:   Met with patient at bedside to review dc therapy and home equipment needs, confirmed OPPT-EO [place], [employer] RW delivered to bedside by [employer]. No CM needs     Final next level of care: OP Rehab Barriers to Discharge: No Barriers Identified   Patient Goals and CMS Choice Patient states their goals for this hospitalization and ongoing recovery are:: return home          Discharge Placement                       Discharge Plan and Services Additional resources added to the After Visit Summary for                                       Social Drivers of Health (SDOH) Interventions SDOH Screenings   Food Insecurity: No Food Insecurity (10/07/2024)  Housing: Low Risk (10/07/2024)  Transportation Needs: No Transportation Needs (10/07/2024)  Utilities: Not At Risk (10/07/2024)  Social Connections: Socially Integrated (10/07/2024)  Tobacco Use: Low Risk (10/07/2024)     Readmission Risk Interventions     No data to display

## 2024-10-08 NOTE — Progress Notes (Signed)
 "  Subjective: 1 Day Post-Op Procedures (LRB): ARTHROPLASTY, KNEE, TOTAL (Right) Patient reports pain as mild.   Patient seen in rounds with Dr. Ernie. Patient is well, and has had no acute complaints or problems. No acute events overnight. Foley catheter removed. Patient ambulated 20 feet with PT.  We will start therapy today.   Objective: Vital signs in last 24 hours: Temp:  [97.2 F (36.2 C)-98.4 F (36.9 C)] 97.9 F (36.6 C) (01/28 9386) Pulse Rate:  [52-74] 56 (01/28 0613) Resp:  [10-21] 18 (01/28 0613) BP: (116-151)/(54-73) 116/64 (01/28 0613) SpO2:  [96 %-100 %] 99 % (01/28 0613) Weight:  [50.8 kg] 50.8 kg (01/27 0841)  Intake/Output from previous day:  Intake/Output Summary (Last 24 hours) at 10/08/2024 0741 Last data filed at 10/08/2024 0600 Gross per 24 hour  Intake 2749.35 ml  Output 1800 ml  Net 949.35 ml     Intake/Output this shift: No intake/output data recorded.  Labs: Recent Labs    10/08/24 0351  HGB 11.0*   Recent Labs    10/08/24 0351  WBC 11.1*  RBC 3.62*  HCT 32.6*  PLT 204   Recent Labs    10/08/24 0351  NA 137  K 4.1  CL 105  CO2 24  BUN 11  CREATININE 0.81  GLUCOSE 116*  CALCIUM  8.4*   No results for input(s): LABPT, INR in the last 72 hours.  Exam: General - Patient is Alert and Oriented Extremity - Neurologically intact Sensation intact distally Intact pulses distally Dorsiflexion/Plantar flexion intact Dressing - dressing C/D/I Motor Function - intact, moving foot and toes well on exam.   Past Medical History:  Diagnosis Date   Allergy    Arthritis    neck,back   Bradycardia    Cardiomegaly    Chest pain    Constipation    history of--miralax  PRN only    Diverticulosis    Dyspnea    Dyspnea on exertion    GERD (gastroesophageal reflux disease)    Hyperlipidemia    on meds   Jaw pain    Orthopnea    Osteopenia    Pancreatic cyst    Pancreatic cyst    PVC (premature ventricular contraction)    SOB  (shortness of breath)    Vitamin D deficiency     Assessment/Plan: 1 Day Post-Op Procedures (LRB): ARTHROPLASTY, KNEE, TOTAL (Right) Principal Problem:   S/P total knee arthroplasty, right  Estimated body mass index is 21.87 kg/m as calculated from the following:   Height as of this encounter: 5' (1.524 m).   Weight as of this encounter: 50.8 kg. Advance diet Up with therapy D/C IV fluids   Patient's anticipated LOS is less than 2 midnights, meeting these requirements: - Younger than 45 - Lives within 1 hour of care - Has a competent adult at home to recover with post-op recover - NO history of  - Chronic pain requiring opiods  - Diabetes  - Coronary Artery Disease  - Heart failure  - Heart attack  - Stroke  - DVT/VTE  - Cardiac arrhythmia  - Respiratory Failure/COPD  - Renal failure  - Anemia  - Advanced Liver disease     DVT Prophylaxis - Aspirin  Weight bearing as tolerated.  Hgb stable at 11 this AM  Patient prefers meds to regular pharmacy   Plan is to go Home after hospital stay. Plan for discharge today following 1-2 sessions of PT as long as they are meeting their goals. Patient is scheduled  for OPPT. Follow up in the office in 2 weeks.   Rosina Calin, PA-C Orthopedic Surgery [phone] 10/08/2024, 7:41 AM  "

## 2024-10-08 NOTE — Progress Notes (Signed)
 PT TX NOTE   10/08/24 1204  PT Visit Information  Last PT Received On 10/08/24  Assistance Needed Pt making excellent progress this pm. No nausea, pain controlled. Pt is meeting goals and ready for d/c from PT standpoint with family assist as needed   History of Present Illness 79 yo female presents to therapy s/p R TKA on 10/07/2024 due to failure of conservative measures. Pt PMH includes but is not limited to: thyroid  nodule s/p R lobectomy, diverticulosis, pancreatic cyst, arthritis, bradycardia, angina, DOE, GERD, HLD, and PVCs.  Subjective Data  Patient Stated Goal to be able to get back to doing yard work, be able to climb steps and take care of the new grandchild in 3 months  Precautions  Precautions Fall;Knee  Recall of Precautions/Restrictions Intact  Restrictions  Weight Bearing Restrictions Per Provider Order No  Pain Assessment  Pain Assessment 0-10  Pain Score 3  Pain Location R LE and Knee  Pain Descriptors / Indicators Aching;Constant;Discomfort;Dull;Grimacing  Pain Intervention(s) Limited activity within patient's tolerance;Monitored during session;Premedicated before session  Cognition  Arousal Alert  Behavior During Therapy WFL for tasks assessed/performed  PT - Cognitive impairments No apparent impairments  Following Commands  Following commands Intact  Cueing  Cueing Techniques Verbal cues  Communication  Communication No apparent difficulties  Bed Mobility  General bed mobility comments in recliner and returned to same  Transfers  Overall transfer level Needs assistance  Equipment used Rolling walker (2 wheels)  Transfers Sit to/from Stand  Sit to Stand Supervision  General transfer comment cues for hand placement and RLE position  Ambulation/Gait  Ambulation/Gait assistance Supervision;Contact guard assist  Gait Distance (Feet) 120 Feet  Assistive device Rolling walker (2 wheels)  Gait Pattern/deviations Step-to pattern;Decreased stance time -  right;Step-through pattern  General Gait Details cues for sequence and RW position, pt demonstrated carryover during session progressing to step through pattern without incr pain  Stairs Yes  Stairs assistance Supervision;Contact guard assist  Stair Management One rail Left;Step to pattern;Forwards  Number of Stairs 5 (x2)  General stair comments cues for sequence and technique. good stability, no LOB  Balance  Sitting-balance support Feet supported  Sitting balance-Leahy Scale Good  Standing balance support Bilateral upper extremity supported;Reliant on assistive device for balance;During functional activity  Standing balance-Leahy Scale Fair  Standing balance comment static standing no UE support  Exercises  Exercises Total Joint  PT - End of Session  Equipment Utilized During Treatment Gait belt  Activity Tolerance No increased pain;Patient tolerated treatment well  Patient left in chair;with call bell/phone within reach;with chair alarm set;with nursing/sitter in room  Nurse Communication Mobility status   PT - Assessment/Plan  PT Visit Diagnosis Unsteadiness on feet (R26.81);Other abnormalities of gait and mobility (R26.89);Muscle weakness (generalized) (M62.81);Difficulty in walking, not elsewhere classified (R26.2);Pain  Pain - Right/Left Right  Pain - part of body Leg;Knee  PT Frequency (ACUTE ONLY) 7X/week  Follow Up Recommendations Follow physician's recommendations for discharge plan and follow up therapies  Patient can return home with the following A little help with walking and/or transfers;A little help with bathing/dressing/bathroom;Assistance with cooking/housework;Assist for transportation;Help with stairs or ramp for entrance  PT equipment Rolling walker (2 wheels) ([employer])  AM-PAC PT 6 Clicks Mobility Outcome Measure (Version 2)  Help needed turning from your back to your side while in a flat bed without using bedrails? 4  Help needed moving from lying on your  back to sitting on the side of a flat bed without using  bedrails? 3  Help needed moving to and from a bed to a chair (including a wheelchair)? 3  Help needed standing up from a chair using your arms (e.g., wheelchair or bedside chair)? 3  Help needed to walk in hospital room? 3  Help needed climbing 3-5 steps with a railing?  2  6 Click Score 18  Consider Recommendation of Discharge To: Home with [facility]  Progressive Mobility  What is the highest level of mobility based on the mobility assessment? Level 4 (Ambulates with assistance) - Balance while stepping forward/back - Complete  Activity Turned to right side;Turned to left side;Turned to back - supine  PT Goal Progression  Progress towards PT goals Progressing toward goals  Acute Rehab PT Goals  PT Goal Formulation With patient  Time For Goal Achievement 10/21/24  Potential to Achieve Goals Good  PT Time Calculation  PT Start Time (ACUTE ONLY) 1156  PT Stop Time (ACUTE ONLY) 1220  PT Time Calculation (min) (ACUTE ONLY) 24 min  PT General Charges  $$ ACUTE PT VISIT 1 Visit  PT Treatments  $Gait Training 23-37 mins

## 2024-10-08 NOTE — Care Management Obs Status (Signed)
 MEDICARE OBSERVATION STATUS NOTIFICATION   Patient Details  Name: MAHLANI BERNINGER MRN: [id number] Date of Birth: 1946-01-30   Medicare Observation Status Notification Given:  Yes    Alfonse JONELLE Rex, RN 10/08/2024, 11:47 AM

## 2024-10-09 ENCOUNTER — Encounter (HOSPITAL_COMMUNITY): Payer: Self-pay | Admitting: Orthopedic Surgery

## 2024-10-09 NOTE — Anesthesia Postprocedure Evaluation (Signed)
"   Anesthesia Post Note  Patient: Jessica Osborn  Procedure(s) Performed: ARTHROPLASTY, KNEE, TOTAL (Right: Knee)     Patient location during evaluation: PACU Anesthesia Type: Regional, Spinal and MAC Level of consciousness: oriented and awake and alert Pain management: pain level controlled Vital Signs Assessment: post-procedure vital signs reviewed and stable Respiratory status: spontaneous breathing, respiratory function stable and patient connected to nasal cannula oxygen Cardiovascular status: blood pressure returned to baseline and stable Postop Assessment: no headache, no backache and no apparent nausea or vomiting Anesthetic complications: no   No notable events documented.  Last Vitals:  Vitals:   10/08/24 0156 10/08/24 0613  BP: (!) 123/54 116/64  Pulse: (!) 55 (!) 56  Resp: 16 18  Temp: 36.7 C 36.6 C  SpO2: 100% 99%    Last Pain:  Vitals:   10/08/24 1112  TempSrc:   PainSc: 2                  [name]      "

## 2024-10-09 NOTE — Discharge Summary (Signed)
 Patient ID: Jessica Osborn MRN: [id number] DOB/AGE: [birth year] 79 y.o.  Admit date: 10/07/2024 Discharge date: 10/08/2024  Admission Diagnoses:  Right knee osteoarthritis  Discharge Diagnoses:  Principal Problem:   S/P total knee arthroplasty, right   Past Medical History:  Diagnosis Date   Allergy    Arthritis    neck,back   Bradycardia    Cardiomegaly    Chest pain    Constipation    history of--miralax  PRN only    Diverticulosis    Dyspnea    Dyspnea on exertion    GERD (gastroesophageal reflux disease)    Hyperlipidemia    on meds   Jaw pain    Orthopnea    Osteopenia    Pancreatic cyst    Pancreatic cyst    PVC (premature ventricular contraction)    SOB (shortness of breath)    Vitamin D deficiency     Surgeries: Procedures: ARTHROPLASTY, KNEE, TOTAL on 10/07/2024   Consultants:   Discharged Condition: Improved  Hospital Course: Jessica Osborn is an 79 y.o. female who was admitted 10/07/2024 for operative treatment ofS/P total knee arthroplasty, right. Patient has severe unremitting pain that affects sleep, daily activities, and work/hobbies. After pre-op clearance the patient was taken to the operating room on 10/07/2024 and underwent  Procedures: ARTHROPLASTY, KNEE, TOTAL.    Patient was given perioperative antibiotics:  Anti-infectives (From admission, onward)    Start     Dose/Rate Route Frequency Ordered Stop   10/07/24 1730  ceFAZolin  (ANCEF ) IVPB 2g/100 mL premix        2 g 200 mL/hr over 30 Minutes Intravenous Every 6 hours 10/07/24 1447 10/08/24 0700   10/07/24 0845  ceFAZolin  (ANCEF ) IVPB 2g/100 mL premix        2 g 200 mL/hr over 30 Minutes Intravenous On call to O.R. 10/07/24 0840 10/07/24 1132        Patient was given sequential compression devices, early ambulation, and chemoprophylaxis to prevent DVT. Patient worked with PT and was meeting their goals regarding safe ambulation and transfers.  Patient benefited maximally from hospital stay  and there were no complications.    Recent vital signs: No data found.   Recent laboratory studies:  Recent Labs    10/08/24 0351  WBC 11.1*  HGB 11.0*  HCT 32.6*  PLT 204  NA 137  K 4.1  CL 105  CO2 24  BUN 11  CREATININE 0.81  GLUCOSE 116*  CALCIUM  8.4*     Discharge Medications:   Allergies as of 10/08/2024   No Known Allergies      Medication List     STOP taking these medications    ibuprofen 200 MG tablet Commonly known as: ADVIL       TAKE these medications    acetaminophen  500 MG tablet Commonly known as: TYLENOL  Take 500 mg by mouth every 6 (six) hours as needed for moderate pain (pain score 4-6).   aspirin  81 MG chewable tablet Chew 1 tablet (81 mg total) by mouth 2 (two) times daily for 28 days.   atorvastatin  20 MG tablet Commonly known as: LIPITOR Take 20 mg by mouth every other day. In the morning   bifidobacterium infantis capsule Take 1 capsule by mouth daily.   BIOTIN PO Take 1,000 mcg by mouth daily.   celecoxib  100 MG capsule Commonly known as: CELEBREX  Take 1 capsule (100 mg total) by mouth 2 (two) times daily as needed (pain).   CITRACAL MAXIMUM PLUS PO Take  2 tablets by mouth daily.   estradiol 0.01 % Crea vaginal cream Commonly known as: ESTRACE Place 1 Applicatorful vaginally 2 (two) times a week.   fexofenadine 180 MG tablet Commonly known as: ALLEGRA Take 180 mg by mouth daily.   fluticasone 50 MCG/ACT nasal spray Commonly known as: FLONASE Place 2 sprays into both nostrils daily as needed for allergies.   ibandronate 150 MG tablet Commonly known as: BONIVA Take 150 mg by mouth every 30 (thirty) days.   Magnesium 250 MG Tabs Take 250 mg by mouth in the morning.   methocarbamol  500 MG tablet Commonly known as: ROBAXIN  Take 1 tablet (500 mg total) by mouth every 6 (six) hours as needed (muscle pain). What changed:  when to take this reasons to take this   MIRALAX  PO Take 17 g by mouth daily as needed  (constipation).   pantoprazole  20 MG tablet Commonly known as: PROTONIX  Take 20 mg by mouth in the morning.   senna 8.6 MG Tabs tablet Commonly known as: SENOKOT Take 2 tablets (17.2 mg total) by mouth at bedtime for 14 days.   Systane Ultra PF 0.4-0.3 % Soln Generic drug: Polyethyl Glyc-Propyl Glyc PF Place 1-2 drops into both eyes 3 (three) times daily as needed (dry/irritated eyes.).   traMADol  50 MG tablet Commonly known as: ULTRAM  Take 1-2 tablets (50-100 mg total) by mouth every 6 (six) hours as needed for severe pain (pain score 7-10). What changed:  how much to take reasons to take this   TUMS PO Take 1 tablet by mouth daily as needed (heartburn).   Vitamin D3 50 MCG (2000 UT) Tabs Take 2,000 Units by mouth in the morning.   vitamin E 180 MG (400 UNITS) capsule Take 400 Units by mouth in the morning.               Discharge Care Instructions  (From admission, onward)           Start     Ordered   10/08/24 0000  Change dressing       Comments: Maintain surgical dressing until follow up in the clinic. If the edges start to pull up, may reinforce with tape. If the dressing is no longer working, may remove and cover with gauze and tape, but must keep the area dry and clean.  Call with any questions or concerns.   10/08/24 0744            Diagnostic Studies: No results found.  Disposition: Discharge disposition: 01-Home or Self Care       Discharge Instructions     Call MD / Call 911   Complete by: As directed    If you experience chest pain or shortness of breath, CALL 911 and be transported to the hospital emergency room.  If you develope a fever above 101 F, pus (white drainage) or increased drainage or redness at the wound, or calf pain, call your surgeon's office.   Change dressing   Complete by: As directed    Maintain surgical dressing until follow up in the clinic. If the edges start to pull up, may reinforce with tape. If the  dressing is no longer working, may remove and cover with gauze and tape, but must keep the area dry and clean.  Call with any questions or concerns.   Constipation Prevention   Complete by: As directed    Drink plenty of fluids.  Prune juice may be helpful.  You may use a stool  softener, such as Colace (over the counter) 100 mg twice a day.  Use MiraLax  (over the counter) for constipation as needed.   Diet - low sodium heart healthy   Complete by: As directed    Increase activity slowly as tolerated   Complete by: As directed    Weight bearing as tolerated with assist device (walker, cane, etc) as directed, use it as long as suggested by your surgeon or therapist, typically at least 4-6 weeks.   Post-operative opioid taper instructions:   Complete by: As directed    POST-OPERATIVE OPIOID TAPER INSTRUCTIONS: It is important to wean off of your opioid medication as soon as possible. If you do not need pain medication after your surgery it is ok to stop day one. Opioids include: Codeine, Hydrocodone(Norco, Vicodin), Oxycodone (Percocet, oxycontin ) and hydromorphone  amongst others.  Long term and even short term use of opiods can cause: Increased pain response Dependence Constipation Depression Respiratory depression And more.  Withdrawal symptoms can include Flu like symptoms Nausea, vomiting And more Techniques to manage these symptoms Hydrate well Eat regular healthy meals Stay active Use relaxation techniques(deep breathing, meditating, yoga) Do Not substitute Alcohol  to help with tapering If you have been on opioids for less than two weeks and do not have pain than it is ok to stop all together.  Plan to wean off of opioids This plan should start within one week post op of your joint replacement. Maintain the same interval or time between taking each dose and first decrease the dose.  Cut the total daily intake of opioids by one tablet each day Next start to increase the time  between doses. The last dose that should be eliminated is the evening dose.      TED hose   Complete by: As directed    Use stockings (TED hose) for 2 weeks on both leg(s).  You may remove them at night for sleeping.        Follow-up Information     Ernie Cough, MD. Schedule an appointment as soon as possible for a visit in 2 week(s).   Specialty: Orthopedic Surgery Contact information: [address] [place] [address] [place] [address] [phone]                  Signed: Rosina JONELLE Calin 10/09/2024, 10:56 AM
